# Patient Record
Sex: Male | Born: 1935 | Race: White | Hispanic: No | Marital: Married | Smoking: Former smoker
Health system: Southern US, Community
[De-identification: ages and names within clinical notes are randomized; demographics above are authoritative.]

## PROBLEM LIST (undated history)

## (undated) DIAGNOSIS — E785 Hyperlipidemia, unspecified: Secondary | ICD-10-CM

## (undated) DIAGNOSIS — I10 Essential (primary) hypertension: Secondary | ICD-10-CM

## (undated) DIAGNOSIS — F419 Anxiety disorder, unspecified: Secondary | ICD-10-CM

## (undated) DIAGNOSIS — E079 Disorder of thyroid, unspecified: Secondary | ICD-10-CM

## (undated) DIAGNOSIS — N529 Male erectile dysfunction, unspecified: Secondary | ICD-10-CM

## (undated) DIAGNOSIS — Z940 Kidney transplant status: Secondary | ICD-10-CM

## (undated) DIAGNOSIS — E119 Type 2 diabetes mellitus without complications: Secondary | ICD-10-CM

## (undated) DIAGNOSIS — I770 Arteriovenous fistula, acquired: Secondary | ICD-10-CM

## (undated) HISTORY — DX: Arteriovenous fistula, acquired: I77.0

## (undated) HISTORY — DX: Essential (primary) hypertension: I10

## (undated) HISTORY — DX: Kidney transplant status: Z94.0

## (undated) HISTORY — DX: Type 2 diabetes mellitus without complications: E11.9

## (undated) HISTORY — DX: Male erectile dysfunction, unspecified: N52.9

## (undated) HISTORY — DX: Anxiety disorder, unspecified: F41.9

## (undated) HISTORY — DX: Hyperlipidemia, unspecified: E78.5

## (undated) HISTORY — DX: Disorder of thyroid, unspecified: E07.9

---

## 2009-08-12 ENCOUNTER — Ambulatory Visit: Payer: Self-pay | Admitting: Vascular Surgery

## 2009-09-19 ENCOUNTER — Ambulatory Visit: Payer: Self-pay | Admitting: Vascular Surgery

## 2009-09-19 ENCOUNTER — Ambulatory Visit (HOSPITAL_COMMUNITY): Admission: RE | Admit: 2009-09-19 | Discharge: 2009-09-19 | Payer: Self-pay | Admitting: Vascular Surgery

## 2009-10-15 ENCOUNTER — Encounter: Admission: RE | Admit: 2009-10-15 | Discharge: 2009-10-15 | Payer: Self-pay | Admitting: Orthopaedic Surgery

## 2009-11-11 ENCOUNTER — Ambulatory Visit: Payer: Self-pay | Admitting: Vascular Surgery

## 2010-07-05 ENCOUNTER — Encounter (HOSPITAL_COMMUNITY): Admission: RE | Admit: 2010-07-05 | Discharge: 2010-10-03 | Payer: Self-pay | Admitting: Nephrology

## 2011-01-18 LAB — POCT HEMOGLOBIN-HEMACUE: Hemoglobin: 10.7 g/dL — ABNORMAL LOW (ref 13.0–17.0)

## 2011-01-18 LAB — IRON AND TIBC
Iron: 123 ug/dL (ref 42–135)
Saturation Ratios: 47 % (ref 20–55)
UIBC: 138 ug/dL

## 2011-01-18 LAB — FERRITIN: Ferritin: 627 ng/mL — ABNORMAL HIGH (ref 22–322)

## 2011-01-19 LAB — POCT HEMOGLOBIN-HEMACUE: Hemoglobin: 9.5 g/dL — ABNORMAL LOW (ref 13.0–17.0)

## 2011-02-07 LAB — POCT I-STAT 4, (NA,K, GLUC, HGB,HCT)
Glucose, Bld: 113 mg/dL — ABNORMAL HIGH (ref 70–99)
Potassium: 3.7 mEq/L (ref 3.5–5.1)

## 2011-03-20 NOTE — Assessment & Plan Note (Signed)
OFFICE VISIT   WOOD[place], Cody Bryan  DOB:  Oct 09, 1931                                       11/11/2009  EAVWU#:[id number]   The patient presents today for followup of his right upper arm AV  fistula creation on 09/19/2009.  He reports that he has had stable renal  function.  He is to see Dr. Lowell Guitar in March.  He has an excellent early  maturation now 6 weeks out from his fistula creation.  His cephalic vein  has very good flow all the way up onto the shoulder.  He does have some  moderate tortuosity of the vein but this does appear that it will be  adequate for access if and when needed.  He will see [place] again on an as  needed basis.     Larina Earthly, M.D.  Electronically Signed   TFE/MEDQ  D:  11/11/2009  T:  11/14/2009  Job:  [id number]   cc:   Mindi Slicker. Lowell Guitar, M.D.

## 2011-03-20 NOTE — Procedures (Signed)
CEPHALIC VEIN MAPPING   INDICATION:  Preop cephalic vein map, preop AVF.   HISTORY:  Chronic kidney disease.   EXAM:   The right cephalic vein is compressible.   Diameter measurements range from 0.17 cm to 0.34 cm, however, 0.31 cm to  0.34 cm in the brachium.   The left cephalic vein is compressible.   Diameter measurements range from 0.19 cm to 0.27 cm.   See attached worksheet for all measurements.   IMPRESSION:  1. Patent bilateral cephalic vein which is of acceptable diameter for      use as a dialysis access site in the right brachium.  Right forearm      and left cephalic vein appear of small caliber.  2. Left basilic vein in the brachium measures 0.4 cm to 0.54 cm.   ___________________________________________  Larina Earthly, M.D.   AS/MEDQ  D:  08/12/2009  T:  08/12/2009  Job:  [id number]

## 2011-03-20 NOTE — Consult Note (Signed)
NEW PATIENT CONSULTATION   Cody Bryan, Cody Bryan  DOB:  January 15, 1931                                       08/12/2009  ZOXWR#:[id number]   The patient presents today for evaluation of AV access.  He is a very  pleasant right handed 77 year old gentleman with a long history of  chronic renal insufficiency.  He recently had an episode of worsening  renal function and reports that this is now somewhat improved.  We are  seeing him to discuss access.  he is reportedly on transplant list at  [facility] as of 07/28/2009.   PAST HISTORY:  Significant for elevated cholesterol.  He does not have a  history of premature atherosclerotic disease.  He is married, quit  smoking in 1998, does not drink alcohol.   REVIEW OF SYSTEMS:  Weight is reported 154 pounds.  He is 5 feet 6  inches tall.  He does report shortness of breath with exertion,  constipation, renal insufficiency.   MEDICATION ALLERGIES:  None.   CURRENT MEDICATIONS:  Coreg.  Calcium.  Lovastatin.  Furosemide.  Levothyroxine.   PHYSICAL EXAM:  Well-developed, well-nourished white male appearing  stated age of 88.  Blood pressure is 157/72, pulse 68, respirations 18.  His radial pulses are 2+.  He has moderate diastolic veins bilaterally.  He underwent vein mapping that showed inadequate vein for cephalic vein  fistula of the left arm.  Right arm does have moderate sized cephalic  vein in the upper arms, swelling in the forearm.  I discussed this at  length with the patient, explained the options including AV graft, AV  fistulas, and hemodialysis access catheters.  I have recommended that we  proceed with a right upper arm AV fistula.  He plays a great deal of  golf and has an upcoming event at the end of October.  He will also have  another followup visit with Dr. Lowell Guitar in the first of November.  He  wishes to defer any consideration for surgery until then.  I explained  that we would be available to proceed with  right upper arm AV fistula at  his convenience.   Larina Earthly, M.D.  Electronically Signed   TFE/MEDQ  D:  08/12/2009  T:  08/15/2009  Job:  [id number]   cc:   Mindi Slicker. Lowell Guitar, M.D.

## 2011-09-19 ENCOUNTER — Ambulatory Visit
Admission: RE | Admit: 2011-09-19 | Discharge: 2011-09-19 | Disposition: A | Discharge status: Home / Self Care | Payer: Medicare Other | Source: Ambulatory Visit | Attending: Family Medicine | Admitting: Family Medicine

## 2011-09-19 ENCOUNTER — Other Ambulatory Visit: Payer: Self-pay | Admitting: Family Medicine

## 2011-11-07 DIAGNOSIS — E782 Mixed hyperlipidemia: Secondary | ICD-10-CM | POA: Diagnosis not present

## 2011-11-07 DIAGNOSIS — J40 Bronchitis, not specified as acute or chronic: Secondary | ICD-10-CM | POA: Diagnosis not present

## 2011-11-07 DIAGNOSIS — E039 Hypothyroidism, unspecified: Secondary | ICD-10-CM | POA: Diagnosis not present

## 2011-11-07 DIAGNOSIS — D045 Carcinoma in situ of skin of trunk: Secondary | ICD-10-CM | POA: Diagnosis not present

## 2011-11-07 DIAGNOSIS — J13 Pneumonia due to Streptococcus pneumoniae: Secondary | ICD-10-CM | POA: Diagnosis not present

## 2011-11-08 DIAGNOSIS — J209 Acute bronchitis, unspecified: Secondary | ICD-10-CM | POA: Diagnosis not present

## 2011-11-09 DIAGNOSIS — E785 Hyperlipidemia, unspecified: Secondary | ICD-10-CM | POA: Diagnosis not present

## 2011-11-09 DIAGNOSIS — E119 Type 2 diabetes mellitus without complications: Secondary | ICD-10-CM | POA: Diagnosis not present

## 2011-11-09 DIAGNOSIS — Z79899 Other long term (current) drug therapy: Secondary | ICD-10-CM | POA: Diagnosis not present

## 2011-11-09 DIAGNOSIS — Z940 Kidney transplant status: Secondary | ICD-10-CM | POA: Diagnosis not present

## 2011-11-09 DIAGNOSIS — E039 Hypothyroidism, unspecified: Secondary | ICD-10-CM | POA: Diagnosis not present

## 2011-11-13 DIAGNOSIS — J40 Bronchitis, not specified as acute or chronic: Secondary | ICD-10-CM | POA: Diagnosis not present

## 2011-11-13 DIAGNOSIS — E782 Mixed hyperlipidemia: Secondary | ICD-10-CM | POA: Diagnosis not present

## 2011-11-13 DIAGNOSIS — J218 Acute bronchiolitis due to other specified organisms: Secondary | ICD-10-CM | POA: Diagnosis not present

## 2011-11-13 DIAGNOSIS — E039 Hypothyroidism, unspecified: Secondary | ICD-10-CM | POA: Diagnosis not present

## 2011-12-06 DIAGNOSIS — E782 Mixed hyperlipidemia: Secondary | ICD-10-CM | POA: Diagnosis not present

## 2011-12-06 DIAGNOSIS — E039 Hypothyroidism, unspecified: Secondary | ICD-10-CM | POA: Diagnosis not present

## 2011-12-06 DIAGNOSIS — J40 Bronchitis, not specified as acute or chronic: Secondary | ICD-10-CM | POA: Diagnosis not present

## 2011-12-20 DIAGNOSIS — I120 Hypertensive chronic kidney disease with stage 5 chronic kidney disease or end stage renal disease: Secondary | ICD-10-CM | POA: Diagnosis not present

## 2011-12-20 DIAGNOSIS — E782 Mixed hyperlipidemia: Secondary | ICD-10-CM | POA: Diagnosis not present

## 2011-12-20 DIAGNOSIS — E039 Hypothyroidism, unspecified: Secondary | ICD-10-CM | POA: Diagnosis not present

## 2011-12-31 DIAGNOSIS — Z79899 Other long term (current) drug therapy: Secondary | ICD-10-CM | POA: Diagnosis not present

## 2011-12-31 DIAGNOSIS — Z940 Kidney transplant status: Secondary | ICD-10-CM | POA: Diagnosis not present

## 2012-01-17 DIAGNOSIS — J40 Bronchitis, not specified as acute or chronic: Secondary | ICD-10-CM | POA: Diagnosis not present

## 2012-01-17 DIAGNOSIS — E782 Mixed hyperlipidemia: Secondary | ICD-10-CM | POA: Diagnosis not present

## 2012-01-17 DIAGNOSIS — E039 Hypothyroidism, unspecified: Secondary | ICD-10-CM | POA: Diagnosis not present

## 2012-01-17 DIAGNOSIS — J13 Pneumonia due to Streptococcus pneumoniae: Secondary | ICD-10-CM | POA: Diagnosis not present

## 2012-01-28 DIAGNOSIS — Z48298 Encounter for aftercare following other organ transplant: Secondary | ICD-10-CM | POA: Diagnosis not present

## 2012-01-28 DIAGNOSIS — M1990 Unspecified osteoarthritis, unspecified site: Secondary | ICD-10-CM | POA: Diagnosis not present

## 2012-01-28 DIAGNOSIS — Z940 Kidney transplant status: Secondary | ICD-10-CM | POA: Diagnosis not present

## 2012-01-28 DIAGNOSIS — E139 Other specified diabetes mellitus without complications: Secondary | ICD-10-CM | POA: Diagnosis not present

## 2012-01-28 DIAGNOSIS — E039 Hypothyroidism, unspecified: Secondary | ICD-10-CM | POA: Diagnosis not present

## 2012-01-28 DIAGNOSIS — E891 Postprocedural hypoinsulinemia: Secondary | ICD-10-CM | POA: Diagnosis not present

## 2012-01-28 DIAGNOSIS — Z79899 Other long term (current) drug therapy: Secondary | ICD-10-CM | POA: Diagnosis not present

## 2012-01-28 DIAGNOSIS — E785 Hyperlipidemia, unspecified: Secondary | ICD-10-CM | POA: Diagnosis not present

## 2012-01-28 DIAGNOSIS — Z85828 Personal history of other malignant neoplasm of skin: Secondary | ICD-10-CM | POA: Diagnosis not present

## 2012-01-28 DIAGNOSIS — I10 Essential (primary) hypertension: Secondary | ICD-10-CM | POA: Diagnosis not present

## 2012-01-29 DIAGNOSIS — H43819 Vitreous degeneration, unspecified eye: Secondary | ICD-10-CM | POA: Diagnosis not present

## 2012-01-29 DIAGNOSIS — H25019 Cortical age-related cataract, unspecified eye: Secondary | ICD-10-CM | POA: Diagnosis not present

## 2012-04-01 DIAGNOSIS — Z79899 Other long term (current) drug therapy: Secondary | ICD-10-CM | POA: Diagnosis not present

## 2012-04-01 DIAGNOSIS — Z940 Kidney transplant status: Secondary | ICD-10-CM | POA: Diagnosis not present

## 2012-04-01 DIAGNOSIS — E119 Type 2 diabetes mellitus without complications: Secondary | ICD-10-CM | POA: Diagnosis not present

## 2012-04-09 DIAGNOSIS — Z940 Kidney transplant status: Secondary | ICD-10-CM | POA: Diagnosis not present

## 2012-05-30 DIAGNOSIS — E785 Hyperlipidemia, unspecified: Secondary | ICD-10-CM | POA: Diagnosis not present

## 2012-05-30 DIAGNOSIS — Z79899 Other long term (current) drug therapy: Secondary | ICD-10-CM | POA: Diagnosis not present

## 2012-05-30 DIAGNOSIS — Z940 Kidney transplant status: Secondary | ICD-10-CM | POA: Diagnosis not present

## 2012-07-15 DIAGNOSIS — R300 Dysuria: Secondary | ICD-10-CM | POA: Diagnosis not present

## 2012-07-15 DIAGNOSIS — N183 Chronic kidney disease, stage 3 unspecified: Secondary | ICD-10-CM | POA: Diagnosis not present

## 2012-07-15 DIAGNOSIS — Z940 Kidney transplant status: Secondary | ICD-10-CM | POA: Diagnosis not present

## 2012-10-08 DIAGNOSIS — Z79899 Other long term (current) drug therapy: Secondary | ICD-10-CM | POA: Diagnosis not present

## 2012-10-08 DIAGNOSIS — Z940 Kidney transplant status: Secondary | ICD-10-CM | POA: Diagnosis not present

## 2012-10-08 DIAGNOSIS — E119 Type 2 diabetes mellitus without complications: Secondary | ICD-10-CM | POA: Diagnosis not present

## 2012-10-22 DIAGNOSIS — Z940 Kidney transplant status: Secondary | ICD-10-CM | POA: Diagnosis not present

## 2012-11-26 DIAGNOSIS — L739 Follicular disorder, unspecified: Secondary | ICD-10-CM | POA: Diagnosis not present

## 2012-11-26 DIAGNOSIS — L570 Actinic keratosis: Secondary | ICD-10-CM | POA: Diagnosis not present

## 2012-12-05 DIAGNOSIS — Z940 Kidney transplant status: Secondary | ICD-10-CM | POA: Diagnosis not present

## 2012-12-05 DIAGNOSIS — Z79899 Other long term (current) drug therapy: Secondary | ICD-10-CM | POA: Diagnosis not present

## 2013-02-04 DIAGNOSIS — Z48298 Encounter for aftercare following other organ transplant: Secondary | ICD-10-CM | POA: Diagnosis not present

## 2013-02-04 DIAGNOSIS — Z79899 Other long term (current) drug therapy: Secondary | ICD-10-CM | POA: Diagnosis not present

## 2013-02-04 DIAGNOSIS — I10 Essential (primary) hypertension: Secondary | ICD-10-CM | POA: Diagnosis not present

## 2013-02-04 DIAGNOSIS — Z940 Kidney transplant status: Secondary | ICD-10-CM | POA: Diagnosis not present

## 2013-02-04 DIAGNOSIS — E119 Type 2 diabetes mellitus without complications: Secondary | ICD-10-CM | POA: Diagnosis not present

## 2013-04-15 DIAGNOSIS — Z940 Kidney transplant status: Secondary | ICD-10-CM | POA: Diagnosis not present

## 2013-04-15 DIAGNOSIS — Z79899 Other long term (current) drug therapy: Secondary | ICD-10-CM | POA: Diagnosis not present

## 2013-04-15 DIAGNOSIS — E119 Type 2 diabetes mellitus without complications: Secondary | ICD-10-CM | POA: Diagnosis not present

## 2013-05-27 DIAGNOSIS — Z940 Kidney transplant status: Secondary | ICD-10-CM | POA: Diagnosis not present

## 2013-06-08 DIAGNOSIS — C770 Secondary and unspecified malignant neoplasm of lymph nodes of head, face and neck: Secondary | ICD-10-CM | POA: Diagnosis not present

## 2013-06-08 DIAGNOSIS — I120 Hypertensive chronic kidney disease with stage 5 chronic kidney disease or end stage renal disease: Secondary | ICD-10-CM | POA: Diagnosis not present

## 2013-07-08 DIAGNOSIS — Z79899 Other long term (current) drug therapy: Secondary | ICD-10-CM | POA: Diagnosis not present

## 2013-07-08 DIAGNOSIS — Z940 Kidney transplant status: Secondary | ICD-10-CM | POA: Diagnosis not present

## 2013-07-08 DIAGNOSIS — E039 Hypothyroidism, unspecified: Secondary | ICD-10-CM | POA: Diagnosis not present

## 2013-07-08 DIAGNOSIS — E119 Type 2 diabetes mellitus without complications: Secondary | ICD-10-CM | POA: Diagnosis not present

## 2013-08-13 DIAGNOSIS — H5310 Unspecified subjective visual disturbances: Secondary | ICD-10-CM | POA: Diagnosis not present

## 2013-08-13 DIAGNOSIS — H53129 Transient visual loss, unspecified eye: Secondary | ICD-10-CM | POA: Diagnosis not present

## 2013-08-13 DIAGNOSIS — H01029 Squamous blepharitis unspecified eye, unspecified eyelid: Secondary | ICD-10-CM | POA: Diagnosis not present

## 2013-08-13 DIAGNOSIS — H1045 Other chronic allergic conjunctivitis: Secondary | ICD-10-CM | POA: Diagnosis not present

## 2013-09-14 DIAGNOSIS — Z79899 Other long term (current) drug therapy: Secondary | ICD-10-CM | POA: Diagnosis not present

## 2013-09-14 DIAGNOSIS — Z940 Kidney transplant status: Secondary | ICD-10-CM | POA: Diagnosis not present

## 2013-09-16 DIAGNOSIS — F411 Generalized anxiety disorder: Secondary | ICD-10-CM | POA: Diagnosis not present

## 2013-09-16 DIAGNOSIS — I129 Hypertensive chronic kidney disease with stage 1 through stage 4 chronic kidney disease, or unspecified chronic kidney disease: Secondary | ICD-10-CM | POA: Diagnosis not present

## 2013-09-16 DIAGNOSIS — Z940 Kidney transplant status: Secondary | ICD-10-CM | POA: Diagnosis not present

## 2013-10-27 DIAGNOSIS — E782 Mixed hyperlipidemia: Secondary | ICD-10-CM | POA: Diagnosis not present

## 2013-10-27 DIAGNOSIS — E119 Type 2 diabetes mellitus without complications: Secondary | ICD-10-CM | POA: Diagnosis not present

## 2013-10-27 DIAGNOSIS — E039 Hypothyroidism, unspecified: Secondary | ICD-10-CM | POA: Diagnosis not present

## 2013-11-25 DIAGNOSIS — E039 Hypothyroidism, unspecified: Secondary | ICD-10-CM | POA: Diagnosis not present

## 2013-11-25 DIAGNOSIS — J00 Acute nasopharyngitis [common cold]: Secondary | ICD-10-CM | POA: Diagnosis not present

## 2013-12-02 DIAGNOSIS — Z940 Kidney transplant status: Secondary | ICD-10-CM | POA: Diagnosis not present

## 2013-12-02 DIAGNOSIS — E119 Type 2 diabetes mellitus without complications: Secondary | ICD-10-CM | POA: Diagnosis not present

## 2013-12-02 DIAGNOSIS — Z79899 Other long term (current) drug therapy: Secondary | ICD-10-CM | POA: Diagnosis not present

## 2014-01-02 DIAGNOSIS — J209 Acute bronchitis, unspecified: Secondary | ICD-10-CM | POA: Diagnosis not present

## 2014-01-02 DIAGNOSIS — J218 Acute bronchiolitis due to other specified organisms: Secondary | ICD-10-CM | POA: Diagnosis not present

## 2014-02-15 DIAGNOSIS — E1129 Type 2 diabetes mellitus with other diabetic kidney complication: Secondary | ICD-10-CM | POA: Diagnosis not present

## 2014-02-15 DIAGNOSIS — E119 Type 2 diabetes mellitus without complications: Secondary | ICD-10-CM | POA: Diagnosis not present

## 2014-02-15 DIAGNOSIS — Z79899 Other long term (current) drug therapy: Secondary | ICD-10-CM | POA: Diagnosis not present

## 2014-02-15 DIAGNOSIS — Z48298 Encounter for aftercare following other organ transplant: Secondary | ICD-10-CM | POA: Diagnosis not present

## 2014-02-15 DIAGNOSIS — E785 Hyperlipidemia, unspecified: Secondary | ICD-10-CM | POA: Diagnosis not present

## 2014-02-15 DIAGNOSIS — I10 Essential (primary) hypertension: Secondary | ICD-10-CM | POA: Diagnosis not present

## 2014-02-15 DIAGNOSIS — N058 Unspecified nephritic syndrome with other morphologic changes: Secondary | ICD-10-CM | POA: Diagnosis not present

## 2014-02-15 DIAGNOSIS — Z940 Kidney transplant status: Secondary | ICD-10-CM | POA: Diagnosis not present

## 2014-03-24 DIAGNOSIS — I10 Essential (primary) hypertension: Secondary | ICD-10-CM | POA: Diagnosis not present

## 2014-03-24 DIAGNOSIS — E119 Type 2 diabetes mellitus without complications: Secondary | ICD-10-CM | POA: Diagnosis not present

## 2014-03-24 DIAGNOSIS — L259 Unspecified contact dermatitis, unspecified cause: Secondary | ICD-10-CM | POA: Diagnosis not present

## 2014-03-24 DIAGNOSIS — Z940 Kidney transplant status: Secondary | ICD-10-CM | POA: Diagnosis not present

## 2014-04-14 DIAGNOSIS — L821 Other seborrheic keratosis: Secondary | ICD-10-CM | POA: Diagnosis not present

## 2014-04-14 DIAGNOSIS — L819 Disorder of pigmentation, unspecified: Secondary | ICD-10-CM | POA: Diagnosis not present

## 2014-04-14 DIAGNOSIS — L570 Actinic keratosis: Secondary | ICD-10-CM | POA: Diagnosis not present

## 2014-04-14 DIAGNOSIS — D235 Other benign neoplasm of skin of trunk: Secondary | ICD-10-CM | POA: Diagnosis not present

## 2014-04-22 DIAGNOSIS — Z940 Kidney transplant status: Secondary | ICD-10-CM | POA: Diagnosis not present

## 2014-05-12 DIAGNOSIS — C44711 Basal cell carcinoma of skin of unspecified lower limb, including hip: Secondary | ICD-10-CM | POA: Diagnosis not present

## 2014-05-12 DIAGNOSIS — L570 Actinic keratosis: Secondary | ICD-10-CM | POA: Diagnosis not present

## 2014-05-27 DIAGNOSIS — C44711 Basal cell carcinoma of skin of unspecified lower limb, including hip: Secondary | ICD-10-CM | POA: Diagnosis not present

## 2014-07-01 DIAGNOSIS — Z940 Kidney transplant status: Secondary | ICD-10-CM | POA: Diagnosis not present

## 2014-07-01 DIAGNOSIS — E039 Hypothyroidism, unspecified: Secondary | ICD-10-CM | POA: Diagnosis not present

## 2014-07-01 DIAGNOSIS — E119 Type 2 diabetes mellitus without complications: Secondary | ICD-10-CM | POA: Diagnosis not present

## 2014-07-07 DIAGNOSIS — L570 Actinic keratosis: Secondary | ICD-10-CM | POA: Diagnosis not present

## 2014-07-07 DIAGNOSIS — Z85828 Personal history of other malignant neoplasm of skin: Secondary | ICD-10-CM | POA: Diagnosis not present

## 2014-07-14 DIAGNOSIS — L259 Unspecified contact dermatitis, unspecified cause: Secondary | ICD-10-CM | POA: Diagnosis not present

## 2014-07-14 DIAGNOSIS — I10 Essential (primary) hypertension: Secondary | ICD-10-CM | POA: Diagnosis not present

## 2014-07-14 DIAGNOSIS — Z940 Kidney transplant status: Secondary | ICD-10-CM | POA: Diagnosis not present

## 2014-07-14 DIAGNOSIS — E119 Type 2 diabetes mellitus without complications: Secondary | ICD-10-CM | POA: Diagnosis not present

## 2014-09-01 DIAGNOSIS — E039 Hypothyroidism, unspecified: Secondary | ICD-10-CM | POA: Diagnosis not present

## 2014-09-01 DIAGNOSIS — Z940 Kidney transplant status: Secondary | ICD-10-CM | POA: Diagnosis not present

## 2014-10-06 DIAGNOSIS — L820 Inflamed seborrheic keratosis: Secondary | ICD-10-CM | POA: Diagnosis not present

## 2014-10-06 DIAGNOSIS — L570 Actinic keratosis: Secondary | ICD-10-CM | POA: Diagnosis not present

## 2014-10-06 DIAGNOSIS — Z08 Encounter for follow-up examination after completed treatment for malignant neoplasm: Secondary | ICD-10-CM | POA: Diagnosis not present

## 2014-10-06 DIAGNOSIS — Z85828 Personal history of other malignant neoplasm of skin: Secondary | ICD-10-CM | POA: Diagnosis not present

## 2014-11-08 DIAGNOSIS — Z940 Kidney transplant status: Secondary | ICD-10-CM | POA: Diagnosis not present

## 2014-11-08 DIAGNOSIS — E039 Hypothyroidism, unspecified: Secondary | ICD-10-CM | POA: Diagnosis not present

## 2014-11-17 DIAGNOSIS — E119 Type 2 diabetes mellitus without complications: Secondary | ICD-10-CM | POA: Diagnosis not present

## 2014-11-17 DIAGNOSIS — R0600 Dyspnea, unspecified: Secondary | ICD-10-CM | POA: Diagnosis not present

## 2014-11-17 DIAGNOSIS — R600 Localized edema: Secondary | ICD-10-CM | POA: Diagnosis not present

## 2014-11-17 DIAGNOSIS — Z940 Kidney transplant status: Secondary | ICD-10-CM | POA: Diagnosis not present

## 2014-12-02 ENCOUNTER — Ambulatory Visit (INDEPENDENT_AMBULATORY_CARE_PROVIDER_SITE_OTHER): Payer: Medicare Other | Admitting: Cardiology

## 2014-12-02 ENCOUNTER — Encounter: Payer: Self-pay | Admitting: Cardiology

## 2014-12-02 VITALS — BP 160/80 | HR 61 | Ht 66.0 in | Wt 158.0 lb

## 2014-12-02 DIAGNOSIS — Z940 Kidney transplant status: Secondary | ICD-10-CM | POA: Diagnosis not present

## 2014-12-02 DIAGNOSIS — I10 Essential (primary) hypertension: Secondary | ICD-10-CM

## 2014-12-02 DIAGNOSIS — E0821 Diabetes mellitus due to underlying condition with diabetic nephropathy: Secondary | ICD-10-CM | POA: Diagnosis not present

## 2014-12-02 DIAGNOSIS — R0600 Dyspnea, unspecified: Secondary | ICD-10-CM | POA: Diagnosis not present

## 2014-12-02 NOTE — Patient Instructions (Signed)
The current medical regimen is effective;  continue present plan and medications.  Your physician has requested that you have a lexiscan myoview. For further information please visit [email or web address]. Please follow instruction sheet, as given.  Your physician has requested that you have an echocardiogram. Echocardiography is a painless test that uses sound waves to create images of your heart. It provides your doctor with information about the size and shape of your heart and how well your heart's chambers and valves are working. This procedure takes approximately one hour. There are no restrictions for this procedure.  Follow up as needed.  Thank you for choosing [facility]!!

## 2014-12-02 NOTE — Progress Notes (Signed)
[facility]. [address], [address], [place]  [address] Phone: [phone] Fax:  [phone]  Date:  12/02/2014   ID:  Cody Bryan, DOB 14-Sep-1931, MRN [id number]  PCP:  No primary care provider on file.   History of Present Illness: Cody Bryan is a 81 y.o. male here for evaluation of dyspnea. Status post renal transplant. Has a fistula in place upper right extremity. Has not needed to utilize. Has never had a diagnosis of high output heart failure as a result of fistula.  In Summertime not much of a problem. In the Winter is worse. Refuse to exercise. Just started about 3 months ago. Driveway 086 ft. Garbage cans, felt some tightness in chest upper, worried him. Had a scare 6 weeks ago, cramp in back, but 15 min later, felt in chest could not stand it. Eased off.   [place], asbestos - Got out [employer] Readings from Last 3 Encounters:  12/02/14 158 lb (71.668 kg)     Past Medical History  Diagnosis Date  . Diabetes mellitus without complication   . Erectile dysfunction   . Deceased-donor kidney transplant   . Thyroid disease     HYPOTHYROIDISM   . Anxiety   . Dyslipidemia   . Hypertension   . AV fistula     UPPER RIGHT EXTREMITY    No past surgical history on file.  Current Outpatient Prescriptions  Medication Sig Dispense Refill  . aspirin 81 MG tablet Take 81 mg by mouth daily.    . carvedilol (COREG) 25 MG tablet Take 25 mg by mouth 2 (two) times daily with a meal.    . cloNIDine (CATAPRES) 0.3 MG tablet Take 0.3 mg by mouth 2 (two) times daily.    . dapsone 25 MG tablet Take 25 mg by mouth daily.    Marland Kitchen glipiZIDE (GLUCOTROL XL) 2.5 MG 24 hr tablet Take 2.5 mg by mouth daily with breakfast.    . levothyroxine (SYNTHROID, LEVOTHROID) 88 MCG tablet Take 88 mcg by mouth daily before breakfast.    . lovastatin (MEVACOR) 40 MG tablet Take 40 mg by mouth at bedtime.    . tacrolimus (PROGRAF) 0.5 MG capsule Take 0.5 mg by mouth 2 (two)  times daily.     No current facility-administered medications for this visit.    Allergies:   No Known Allergies  Social History:  The patient  reports that he has quit smoking. He does not have any smokeless tobacco history on file. Quit smoking after 50 years. Quit because of tax raise.   No early CAD. Father lived to 8.   ROS:  Please see the history of present illness.   No bleeding, no syncope, no orthopnea, no PND. Positive for occasional atypical chest pain, dyspnea. Mild edema.  All other systems reviewed and negative.   PHYSICAL EXAM: VS:  BP 160/80 mmHg  Pulse 61  Ht 5\' 6"  (1.676 m)  Wt 158 lb (71.668 kg)  BMI 25.51 kg/m2 Well nourished, well developed, in no acute distress HEENT: normal, [place]/AT, EOMI Neck: no JVD, normal carotid upstroke, no bruit Cardiac:  normal S1, S2; RRR; 1/6 holosystolic murmur heard left upper sternal border Lungs:  clear to auscultation bilaterally, no wheezing, rhonchi or rales Abd: soft, nontender, no hepatomegaly, no bruits Ext: Trace edema, 2+ distal pulses, right arm fistula noted bruit. Skin: warm and dry GU: deferred Neuro: no focal abnormalities noted, AAO x 3  EKG:  12/02/14-sinus rhythm, 64, no other abnormalities   ASSESSMENT AND PLAN:  1. Atypical chest pain/dyspnea-given his diabetes which is a coronary artery disease equivalent we will go ahead and perform a nuclear stress test, pharmacologic to exclude ischemia. I will also check an echocardiogram given his dyspnea to ensure that he does not have a cardiomyopathy. Another thought given his fistula could be high output heart failure although this is fairly rare. Deconditioning may be playing a role as well. He is more active in the summer time it does not seem to have as much of a dyspnea issue. Creatinine has been stable at 1.5, hemoglobin 12.0. We will follow-up results of studies. If cardiac evaluation is unremarkable, I would suggest further pulmonary evaluation given his  extensive smoking history as well as prior asbestos exposure.  Signed, Candee Furbish, MD [facility]  12/02/2014 2:47 PM

## 2014-12-08 ENCOUNTER — Ambulatory Visit (HOSPITAL_COMMUNITY): Payer: Medicare Other | Attending: Internal Medicine | Admitting: Cardiology

## 2014-12-08 ENCOUNTER — Ambulatory Visit (HOSPITAL_BASED_OUTPATIENT_CLINIC_OR_DEPARTMENT_OTHER): Payer: Medicare Other | Admitting: Radiology

## 2014-12-08 DIAGNOSIS — R079 Chest pain, unspecified: Secondary | ICD-10-CM

## 2014-12-08 DIAGNOSIS — R0600 Dyspnea, unspecified: Secondary | ICD-10-CM | POA: Diagnosis not present

## 2014-12-08 MED ORDER — REGADENOSON 0.4 MG/5ML IV SOLN
0.4000 mg | Freq: Once | INTRAVENOUS | Status: AC
  Administered 2014-12-08: 0.4 mg via INTRAVENOUS

## 2014-12-08 MED ORDER — TECHNETIUM TC 99M SESTAMIBI GENERIC - CARDIOLITE
30.0000 | Freq: Once | INTRAVENOUS | Status: AC | PRN
  Administered 2014-12-08: 30 via INTRAVENOUS

## 2014-12-08 MED ORDER — TECHNETIUM TC 99M SESTAMIBI GENERIC - CARDIOLITE
10.0000 | Freq: Once | INTRAVENOUS | Status: AC | PRN
  Administered 2014-12-08: 10 via INTRAVENOUS

## 2014-12-08 NOTE — Progress Notes (Signed)
Echo performed. 

## 2014-12-08 NOTE — Progress Notes (Signed)
[facility] [place], [place] [address] [phone]    Cardiology Nuclear Med Study  Cody Bryan is a 81 y.o. male     MRN : [id number]     DOB: 1931/02/17  Procedure Date: 12/08/2014  Nuclear Med Background Indication for Stress Test:  Evaluation for Ischemia History: No prior known history of CAD, and 2 or 3 years ago Myocardial Perfusion Imaging-Normal at [facility] per patient Cardiac Risk Factors: Hypertension and NIDDM  Symptoms: Chest Tightness with/without exertion (last occurrence yesterday), DOE and SOB   Nuclear Pre-Procedure Caffeine/Decaff Intake:  None NPO After: 9:00pm   Lungs:  clear O2 Sat: 93% on room air. IV 0.9% NS with Angio Cath:  22g  IV Site: L Antecubital  IV Started by:  Crissie Figures, RN  Chest Size (in):  44 Cup Size: n/a  Height: 5\' 6"  (1.676 m)  Weight:  158 lb (71.668 kg)  BMI:  Body mass index is 25.51 kg/(m^2). Tech Comments:  N/A    Nuclear Med Study 1 or 2 day study: 1 day  Stress Test Type:  Lexiscan  Reading MD: N/A  Order Authorizing Provider:  Candee Furbish, MD  Resting Radionuclide: Technetium 38m Sestamibi  Resting Radionuclide Dose: 11.0 mCi   Stress Radionuclide:  Technetium 3m Sestamibi  Stress Radionuclide Dose: 33.0 mCi           Stress Protocol Rest HR: 53 Stress HR: 77  Rest BP: 132/65 Stress BP: 158/66  Exercise Time (min): n/a METS: n/a   Predicted Max HR: 139 bpm % Max HR: 55.4 bpm Rate Pressure Product: 12166   Dose of Adenosine (mg):  n/a Dose of Lexiscan: 0.4 mg  Dose of Atropine (mg): n/a Dose of Dobutamine: n/a mcg/kg/min (at max HR)  Stress Test Technologist: Irven Baltimore, RN  Nuclear Technologist:  Earl Many, CNMT     Rest Procedure:  Myocardial perfusion imaging was performed at rest 45 minutes following the intravenous administration of Technetium 26m Sestamibi. Rest ECG: NSR - Normal EKG  Stress Procedure:  The patient received IV Lexiscan 0.4 mg over  15-seconds.  Technetium 39m Sestamibi injected at 30-seconds.  The patient complained of SOB, chest tightness, and weakness with Lexiscan. Quantitative spect images were obtained after a 45 minute delay. Stress ECG: No significant change from baseline ECG  QPS Raw Data Images:  Normal; no motion artifact; normal heart/lung ratio. Stress Images:  Medium sized, mild basal to mid inferior perfusion defect. Rest Images:  Medium-sized, mild basal to mid inferior perfusion defect.  Subtraction (SDS):  Fixed medium-sized, mild basal to mid inferior perfusion defect.  Transient Ischemic Dilatation (Normal <1.22):  1.00 Lung/Heart Ratio (Normal <0.45):  0.27  Quantitative Gated Spect Images QGS EDV:  122 ml QGS ESV:  47 ml  Impression Exercise Capacity:  Lexiscan with no exercise. BP Response:  Normal blood pressure response. Clinical Symptoms:  Weakness, chest tightness, dyspnea.  ECG Impression:  No ischemic changes.  Comparison with Prior Nuclear Study: No images to compare  Overall Impression:  Low risk stress nuclear study with a fixed medium-sized, mild basal to mid inferior perfusion defect.  Defect actually looks worse at rest than with stress.  Given normal inferior wall motion, this may be diaphragmatic attenuation.  No ischemia. .  LV Ejection Fraction: 61%.  LV Wall Motion:  NL LV Function; NL Wall Motion   [name] 12/08/2014

## 2014-12-24 DIAGNOSIS — Z940 Kidney transplant status: Secondary | ICD-10-CM | POA: Diagnosis not present

## 2014-12-27 DIAGNOSIS — Z940 Kidney transplant status: Secondary | ICD-10-CM | POA: Diagnosis not present

## 2015-01-03 DIAGNOSIS — Z940 Kidney transplant status: Secondary | ICD-10-CM | POA: Diagnosis not present

## 2015-01-18 DIAGNOSIS — Z940 Kidney transplant status: Secondary | ICD-10-CM | POA: Diagnosis not present

## 2015-01-18 DIAGNOSIS — E039 Hypothyroidism, unspecified: Secondary | ICD-10-CM | POA: Diagnosis not present

## 2015-01-26 DIAGNOSIS — L570 Actinic keratosis: Secondary | ICD-10-CM | POA: Diagnosis not present

## 2015-01-26 DIAGNOSIS — L720 Epidermal cyst: Secondary | ICD-10-CM | POA: Diagnosis not present

## 2015-01-26 DIAGNOSIS — D485 Neoplasm of uncertain behavior of skin: Secondary | ICD-10-CM | POA: Diagnosis not present

## 2015-02-16 DIAGNOSIS — Z23 Encounter for immunization: Secondary | ICD-10-CM | POA: Diagnosis not present

## 2015-02-16 DIAGNOSIS — E785 Hyperlipidemia, unspecified: Secondary | ICD-10-CM | POA: Diagnosis not present

## 2015-02-16 DIAGNOSIS — E119 Type 2 diabetes mellitus without complications: Secondary | ICD-10-CM | POA: Diagnosis not present

## 2015-02-16 DIAGNOSIS — I120 Hypertensive chronic kidney disease with stage 5 chronic kidney disease or end stage renal disease: Secondary | ICD-10-CM | POA: Diagnosis not present

## 2015-02-16 DIAGNOSIS — Z940 Kidney transplant status: Secondary | ICD-10-CM | POA: Diagnosis not present

## 2015-02-16 DIAGNOSIS — Z792 Long term (current) use of antibiotics: Secondary | ICD-10-CM | POA: Diagnosis not present

## 2015-02-16 DIAGNOSIS — D8989 Other specified disorders involving the immune mechanism, not elsewhere classified: Secondary | ICD-10-CM | POA: Diagnosis not present

## 2015-02-16 DIAGNOSIS — D899 Disorder involving the immune mechanism, unspecified: Secondary | ICD-10-CM | POA: Diagnosis not present

## 2015-02-16 DIAGNOSIS — N186 End stage renal disease: Secondary | ICD-10-CM | POA: Diagnosis not present

## 2015-02-16 DIAGNOSIS — L989 Disorder of the skin and subcutaneous tissue, unspecified: Secondary | ICD-10-CM | POA: Diagnosis not present

## 2015-02-16 DIAGNOSIS — Z7982 Long term (current) use of aspirin: Secondary | ICD-10-CM | POA: Diagnosis not present

## 2015-02-23 DIAGNOSIS — Z940 Kidney transplant status: Secondary | ICD-10-CM | POA: Diagnosis not present

## 2015-03-09 DIAGNOSIS — Z940 Kidney transplant status: Secondary | ICD-10-CM | POA: Diagnosis not present

## 2015-05-11 DIAGNOSIS — J209 Acute bronchitis, unspecified: Secondary | ICD-10-CM | POA: Diagnosis not present

## 2015-05-11 DIAGNOSIS — J0100 Acute maxillary sinusitis, unspecified: Secondary | ICD-10-CM | POA: Diagnosis not present

## 2015-05-18 DIAGNOSIS — E119 Type 2 diabetes mellitus without complications: Secondary | ICD-10-CM | POA: Diagnosis not present

## 2015-05-18 DIAGNOSIS — R0600 Dyspnea, unspecified: Secondary | ICD-10-CM | POA: Diagnosis not present

## 2015-05-18 DIAGNOSIS — Z940 Kidney transplant status: Secondary | ICD-10-CM | POA: Diagnosis not present

## 2015-05-23 ENCOUNTER — Encounter: Payer: Self-pay | Admitting: Cardiology

## 2015-05-26 DIAGNOSIS — Z940 Kidney transplant status: Secondary | ICD-10-CM | POA: Diagnosis not present

## 2015-08-17 DIAGNOSIS — Z940 Kidney transplant status: Secondary | ICD-10-CM | POA: Diagnosis not present

## 2015-08-17 DIAGNOSIS — E119 Type 2 diabetes mellitus without complications: Secondary | ICD-10-CM | POA: Diagnosis not present

## 2015-08-17 DIAGNOSIS — E039 Hypothyroidism, unspecified: Secondary | ICD-10-CM | POA: Diagnosis not present

## 2015-08-17 DIAGNOSIS — E785 Hyperlipidemia, unspecified: Secondary | ICD-10-CM | POA: Diagnosis not present

## 2015-09-12 DIAGNOSIS — H353131 Nonexudative age-related macular degeneration, bilateral, early dry stage: Secondary | ICD-10-CM | POA: Diagnosis not present

## 2015-09-12 DIAGNOSIS — H2513 Age-related nuclear cataract, bilateral: Secondary | ICD-10-CM | POA: Diagnosis not present

## 2015-09-20 DIAGNOSIS — J0100 Acute maxillary sinusitis, unspecified: Secondary | ICD-10-CM | POA: Diagnosis not present

## 2015-11-01 ENCOUNTER — Encounter: Payer: Self-pay | Admitting: *Deleted

## 2015-11-02 ENCOUNTER — Encounter: Payer: Self-pay | Admitting: *Deleted

## 2015-11-30 DIAGNOSIS — L570 Actinic keratosis: Secondary | ICD-10-CM | POA: Diagnosis not present

## 2015-11-30 DIAGNOSIS — Z85828 Personal history of other malignant neoplasm of skin: Secondary | ICD-10-CM | POA: Diagnosis not present

## 2015-12-28 DIAGNOSIS — E119 Type 2 diabetes mellitus without complications: Secondary | ICD-10-CM | POA: Diagnosis not present

## 2015-12-28 DIAGNOSIS — E785 Hyperlipidemia, unspecified: Secondary | ICD-10-CM | POA: Diagnosis not present

## 2015-12-28 DIAGNOSIS — Z940 Kidney transplant status: Secondary | ICD-10-CM | POA: Diagnosis not present

## 2015-12-28 DIAGNOSIS — R0600 Dyspnea, unspecified: Secondary | ICD-10-CM | POA: Diagnosis not present

## 2016-01-09 DIAGNOSIS — Z940 Kidney transplant status: Secondary | ICD-10-CM | POA: Diagnosis not present

## 2016-02-20 DIAGNOSIS — E119 Type 2 diabetes mellitus without complications: Secondary | ICD-10-CM | POA: Diagnosis not present

## 2016-02-20 DIAGNOSIS — E785 Hyperlipidemia, unspecified: Secondary | ICD-10-CM | POA: Diagnosis not present

## 2016-02-20 DIAGNOSIS — Z7982 Long term (current) use of aspirin: Secondary | ICD-10-CM | POA: Diagnosis not present

## 2016-02-20 DIAGNOSIS — C4490 Unspecified malignant neoplasm of skin, unspecified: Secondary | ICD-10-CM | POA: Diagnosis not present

## 2016-02-20 DIAGNOSIS — I120 Hypertensive chronic kidney disease with stage 5 chronic kidney disease or end stage renal disease: Secondary | ICD-10-CM | POA: Diagnosis not present

## 2016-02-20 DIAGNOSIS — I10 Essential (primary) hypertension: Secondary | ICD-10-CM | POA: Diagnosis not present

## 2016-02-20 DIAGNOSIS — E039 Hypothyroidism, unspecified: Secondary | ICD-10-CM | POA: Diagnosis not present

## 2016-02-20 DIAGNOSIS — Z940 Kidney transplant status: Secondary | ICD-10-CM | POA: Diagnosis not present

## 2016-02-20 DIAGNOSIS — M1990 Unspecified osteoarthritis, unspecified site: Secondary | ICD-10-CM | POA: Diagnosis not present

## 2016-02-20 DIAGNOSIS — Z85828 Personal history of other malignant neoplasm of skin: Secondary | ICD-10-CM | POA: Diagnosis not present

## 2016-02-20 DIAGNOSIS — Z8719 Personal history of other diseases of the digestive system: Secondary | ICD-10-CM | POA: Diagnosis not present

## 2016-02-20 DIAGNOSIS — Z79899 Other long term (current) drug therapy: Secondary | ICD-10-CM | POA: Diagnosis not present

## 2016-03-02 DIAGNOSIS — Z940 Kidney transplant status: Secondary | ICD-10-CM | POA: Diagnosis not present

## 2016-04-06 DIAGNOSIS — M79602 Pain in left arm: Secondary | ICD-10-CM | POA: Diagnosis not present

## 2016-04-06 DIAGNOSIS — M79601 Pain in right arm: Secondary | ICD-10-CM | POA: Diagnosis not present

## 2016-05-30 DIAGNOSIS — L570 Actinic keratosis: Secondary | ICD-10-CM | POA: Diagnosis not present

## 2016-05-30 DIAGNOSIS — L853 Xerosis cutis: Secondary | ICD-10-CM | POA: Diagnosis not present

## 2016-05-30 DIAGNOSIS — L821 Other seborrheic keratosis: Secondary | ICD-10-CM | POA: Diagnosis not present

## 2016-07-25 DIAGNOSIS — E785 Hyperlipidemia, unspecified: Secondary | ICD-10-CM | POA: Diagnosis not present

## 2016-07-25 DIAGNOSIS — Z6827 Body mass index (BMI) 27.0-27.9, adult: Secondary | ICD-10-CM | POA: Diagnosis not present

## 2016-07-25 DIAGNOSIS — R300 Dysuria: Secondary | ICD-10-CM | POA: Diagnosis not present

## 2016-07-25 DIAGNOSIS — E119 Type 2 diabetes mellitus without complications: Secondary | ICD-10-CM | POA: Diagnosis not present

## 2016-07-25 DIAGNOSIS — Z940 Kidney transplant status: Secondary | ICD-10-CM | POA: Diagnosis not present

## 2016-07-30 DIAGNOSIS — R319 Hematuria, unspecified: Secondary | ICD-10-CM | POA: Diagnosis not present

## 2016-09-12 DIAGNOSIS — H353132 Nonexudative age-related macular degeneration, bilateral, intermediate dry stage: Secondary | ICD-10-CM | POA: Diagnosis not present

## 2016-09-12 DIAGNOSIS — H2513 Age-related nuclear cataract, bilateral: Secondary | ICD-10-CM | POA: Diagnosis not present

## 2016-10-05 DIAGNOSIS — R350 Frequency of micturition: Secondary | ICD-10-CM | POA: Diagnosis not present

## 2016-10-05 DIAGNOSIS — R351 Nocturia: Secondary | ICD-10-CM | POA: Diagnosis not present

## 2016-10-05 DIAGNOSIS — R3121 Asymptomatic microscopic hematuria: Secondary | ICD-10-CM | POA: Diagnosis not present

## 2016-10-17 DIAGNOSIS — R3129 Other microscopic hematuria: Secondary | ICD-10-CM | POA: Diagnosis not present

## 2016-10-17 DIAGNOSIS — R3121 Asymptomatic microscopic hematuria: Secondary | ICD-10-CM | POA: Diagnosis not present

## 2016-10-24 DIAGNOSIS — N2889 Other specified disorders of kidney and ureter: Secondary | ICD-10-CM | POA: Diagnosis not present

## 2016-10-24 DIAGNOSIS — Z940 Kidney transplant status: Secondary | ICD-10-CM | POA: Diagnosis not present

## 2016-10-24 DIAGNOSIS — R8290 Unspecified abnormal findings in urine: Secondary | ICD-10-CM | POA: Diagnosis not present

## 2016-11-07 DIAGNOSIS — E119 Type 2 diabetes mellitus without complications: Secondary | ICD-10-CM | POA: Diagnosis not present

## 2016-11-07 DIAGNOSIS — R8290 Unspecified abnormal findings in urine: Secondary | ICD-10-CM | POA: Diagnosis not present

## 2016-11-07 DIAGNOSIS — R319 Hematuria, unspecified: Secondary | ICD-10-CM | POA: Diagnosis not present

## 2016-11-07 DIAGNOSIS — N329 Bladder disorder, unspecified: Secondary | ICD-10-CM | POA: Diagnosis not present

## 2016-11-07 DIAGNOSIS — Z7982 Long term (current) use of aspirin: Secondary | ICD-10-CM | POA: Diagnosis not present

## 2016-11-07 DIAGNOSIS — E1122 Type 2 diabetes mellitus with diabetic chronic kidney disease: Secondary | ICD-10-CM | POA: Diagnosis not present

## 2016-11-07 DIAGNOSIS — I129 Hypertensive chronic kidney disease with stage 1 through stage 4 chronic kidney disease, or unspecified chronic kidney disease: Secondary | ICD-10-CM | POA: Diagnosis not present

## 2016-11-07 DIAGNOSIS — Z7984 Long term (current) use of oral hypoglycemic drugs: Secondary | ICD-10-CM | POA: Diagnosis not present

## 2016-11-07 DIAGNOSIS — I10 Essential (primary) hypertension: Secondary | ICD-10-CM | POA: Diagnosis not present

## 2016-11-07 DIAGNOSIS — Z940 Kidney transplant status: Secondary | ICD-10-CM | POA: Diagnosis not present

## 2016-11-07 DIAGNOSIS — R931 Abnormal findings on diagnostic imaging of heart and coronary circulation: Secondary | ICD-10-CM | POA: Diagnosis not present

## 2016-11-07 DIAGNOSIS — Z86018 Personal history of other benign neoplasm: Secondary | ICD-10-CM | POA: Diagnosis not present

## 2016-11-07 DIAGNOSIS — J984 Other disorders of lung: Secondary | ICD-10-CM | POA: Diagnosis not present

## 2016-11-07 DIAGNOSIS — R791 Abnormal coagulation profile: Secondary | ICD-10-CM | POA: Diagnosis not present

## 2016-11-07 DIAGNOSIS — N3289 Other specified disorders of bladder: Secondary | ICD-10-CM | POA: Diagnosis not present

## 2016-11-07 DIAGNOSIS — N189 Chronic kidney disease, unspecified: Secondary | ICD-10-CM | POA: Diagnosis not present

## 2016-11-07 DIAGNOSIS — Z79899 Other long term (current) drug therapy: Secondary | ICD-10-CM | POA: Diagnosis not present

## 2016-11-07 DIAGNOSIS — Z87891 Personal history of nicotine dependence: Secondary | ICD-10-CM | POA: Diagnosis not present

## 2016-11-14 DIAGNOSIS — J069 Acute upper respiratory infection, unspecified: Secondary | ICD-10-CM | POA: Diagnosis not present

## 2016-11-28 DIAGNOSIS — L570 Actinic keratosis: Secondary | ICD-10-CM | POA: Diagnosis not present

## 2016-12-21 DIAGNOSIS — Z79899 Other long term (current) drug therapy: Secondary | ICD-10-CM | POA: Diagnosis not present

## 2016-12-21 DIAGNOSIS — Z01818 Encounter for other preprocedural examination: Secondary | ICD-10-CM | POA: Diagnosis not present

## 2016-12-21 DIAGNOSIS — J929 Pleural plaque without asbestos: Secondary | ICD-10-CM | POA: Diagnosis not present

## 2016-12-21 DIAGNOSIS — R931 Abnormal findings on diagnostic imaging of heart and coronary circulation: Secondary | ICD-10-CM | POA: Diagnosis not present

## 2016-12-21 DIAGNOSIS — R918 Other nonspecific abnormal finding of lung field: Secondary | ICD-10-CM | POA: Diagnosis not present

## 2016-12-21 DIAGNOSIS — F17210 Nicotine dependence, cigarettes, uncomplicated: Secondary | ICD-10-CM | POA: Diagnosis not present

## 2016-12-21 DIAGNOSIS — J8489 Other specified interstitial pulmonary diseases: Secondary | ICD-10-CM | POA: Diagnosis not present

## 2016-12-21 DIAGNOSIS — Z940 Kidney transplant status: Secondary | ICD-10-CM | POA: Diagnosis not present

## 2017-01-08 ENCOUNTER — Other Ambulatory Visit: Payer: Self-pay | Admitting: Family Medicine

## 2017-01-08 ENCOUNTER — Ambulatory Visit
Admission: RE | Admit: 2017-01-08 | Discharge: 2017-01-08 | Disposition: A | Discharge status: Home / Self Care | Payer: Medicare Other | Source: Ambulatory Visit | Attending: Family Medicine | Admitting: Family Medicine

## 2017-01-08 DIAGNOSIS — E039 Hypothyroidism, unspecified: Secondary | ICD-10-CM | POA: Diagnosis not present

## 2017-01-08 DIAGNOSIS — I714 Abdominal aortic aneurysm, without rupture: Secondary | ICD-10-CM | POA: Diagnosis not present

## 2017-01-08 DIAGNOSIS — Z0000 Encounter for general adult medical examination without abnormal findings: Secondary | ICD-10-CM | POA: Diagnosis not present

## 2017-01-08 DIAGNOSIS — R05 Cough: Secondary | ICD-10-CM | POA: Diagnosis not present

## 2017-01-08 DIAGNOSIS — I10 Essential (primary) hypertension: Secondary | ICD-10-CM | POA: Diagnosis not present

## 2017-01-08 DIAGNOSIS — R059 Cough, unspecified: Secondary | ICD-10-CM

## 2017-01-08 DIAGNOSIS — D649 Anemia, unspecified: Secondary | ICD-10-CM | POA: Diagnosis not present

## 2017-01-08 DIAGNOSIS — R0602 Shortness of breath: Secondary | ICD-10-CM

## 2017-01-08 DIAGNOSIS — J189 Pneumonia, unspecified organism: Secondary | ICD-10-CM | POA: Diagnosis not present

## 2017-01-17 DIAGNOSIS — E119 Type 2 diabetes mellitus without complications: Secondary | ICD-10-CM | POA: Diagnosis not present

## 2017-01-17 DIAGNOSIS — Z01810 Encounter for preprocedural cardiovascular examination: Secondary | ICD-10-CM | POA: Diagnosis not present

## 2017-01-17 DIAGNOSIS — Z539 Procedure and treatment not carried out, unspecified reason: Secondary | ICD-10-CM | POA: Diagnosis not present

## 2017-01-17 DIAGNOSIS — Z01812 Encounter for preprocedural laboratory examination: Secondary | ICD-10-CM | POA: Diagnosis not present

## 2017-01-18 DIAGNOSIS — J129 Viral pneumonia, unspecified: Secondary | ICD-10-CM | POA: Diagnosis not present

## 2017-01-24 DIAGNOSIS — J129 Viral pneumonia, unspecified: Secondary | ICD-10-CM | POA: Diagnosis not present

## 2017-01-24 DIAGNOSIS — R05 Cough: Secondary | ICD-10-CM | POA: Diagnosis not present

## 2017-01-24 DIAGNOSIS — M7989 Other specified soft tissue disorders: Secondary | ICD-10-CM | POA: Diagnosis not present

## 2017-01-24 DIAGNOSIS — R0602 Shortness of breath: Secondary | ICD-10-CM | POA: Diagnosis not present

## 2017-02-04 ENCOUNTER — Other Ambulatory Visit: Payer: Self-pay | Admitting: Family Medicine

## 2017-02-04 DIAGNOSIS — R062 Wheezing: Secondary | ICD-10-CM | POA: Diagnosis not present

## 2017-02-04 DIAGNOSIS — J189 Pneumonia, unspecified organism: Secondary | ICD-10-CM | POA: Diagnosis not present

## 2017-02-04 DIAGNOSIS — R05 Cough: Secondary | ICD-10-CM | POA: Diagnosis not present

## 2017-02-04 DIAGNOSIS — N179 Acute kidney failure, unspecified: Secondary | ICD-10-CM | POA: Diagnosis not present

## 2017-02-04 DIAGNOSIS — Z940 Kidney transplant status: Secondary | ICD-10-CM

## 2017-02-04 DIAGNOSIS — E1122 Type 2 diabetes mellitus with diabetic chronic kidney disease: Secondary | ICD-10-CM | POA: Diagnosis not present

## 2017-02-04 DIAGNOSIS — R0602 Shortness of breath: Secondary | ICD-10-CM

## 2017-02-04 DIAGNOSIS — E872 Acidosis: Secondary | ICD-10-CM | POA: Diagnosis not present

## 2017-02-04 DIAGNOSIS — R072 Precordial pain: Secondary | ICD-10-CM | POA: Diagnosis not present

## 2017-02-04 DIAGNOSIS — T8619 Other complication of kidney transplant: Secondary | ICD-10-CM | POA: Diagnosis not present

## 2017-02-04 DIAGNOSIS — J984 Other disorders of lung: Secondary | ICD-10-CM | POA: Diagnosis not present

## 2017-02-04 DIAGNOSIS — I272 Pulmonary hypertension, unspecified: Secondary | ICD-10-CM | POA: Diagnosis not present

## 2017-02-05 DIAGNOSIS — Z79890 Hormone replacement therapy (postmenopausal): Secondary | ICD-10-CM | POA: Diagnosis not present

## 2017-02-05 DIAGNOSIS — J181 Lobar pneumonia, unspecified organism: Secondary | ICD-10-CM | POA: Diagnosis not present

## 2017-02-05 DIAGNOSIS — Z87891 Personal history of nicotine dependence: Secondary | ICD-10-CM | POA: Diagnosis not present

## 2017-02-05 DIAGNOSIS — R197 Diarrhea, unspecified: Secondary | ICD-10-CM | POA: Diagnosis not present

## 2017-02-05 DIAGNOSIS — R918 Other nonspecific abnormal finding of lung field: Secondary | ICD-10-CM | POA: Diagnosis not present

## 2017-02-05 DIAGNOSIS — R509 Fever, unspecified: Secondary | ICD-10-CM | POA: Diagnosis not present

## 2017-02-05 DIAGNOSIS — I1311 Hypertensive heart and chronic kidney disease without heart failure, with stage 5 chronic kidney disease, or end stage renal disease: Secondary | ICD-10-CM | POA: Diagnosis not present

## 2017-02-05 DIAGNOSIS — R001 Bradycardia, unspecified: Secondary | ICD-10-CM | POA: Diagnosis not present

## 2017-02-05 DIAGNOSIS — Z7984 Long term (current) use of oral hypoglycemic drugs: Secondary | ICD-10-CM | POA: Diagnosis not present

## 2017-02-05 DIAGNOSIS — N189 Chronic kidney disease, unspecified: Secondary | ICD-10-CM | POA: Diagnosis not present

## 2017-02-05 DIAGNOSIS — E860 Dehydration: Secondary | ICD-10-CM | POA: Diagnosis present

## 2017-02-05 DIAGNOSIS — Z5181 Encounter for therapeutic drug level monitoring: Secondary | ICD-10-CM | POA: Diagnosis not present

## 2017-02-05 DIAGNOSIS — I129 Hypertensive chronic kidney disease with stage 1 through stage 4 chronic kidney disease, or unspecified chronic kidney disease: Secondary | ICD-10-CM | POA: Diagnosis not present

## 2017-02-05 DIAGNOSIS — R0602 Shortness of breath: Secondary | ICD-10-CM | POA: Diagnosis not present

## 2017-02-05 DIAGNOSIS — Z7982 Long term (current) use of aspirin: Secondary | ICD-10-CM | POA: Diagnosis not present

## 2017-02-05 DIAGNOSIS — I120 Hypertensive chronic kidney disease with stage 5 chronic kidney disease or end stage renal disease: Secondary | ICD-10-CM | POA: Diagnosis present

## 2017-02-05 DIAGNOSIS — E039 Hypothyroidism, unspecified: Secondary | ICD-10-CM | POA: Diagnosis not present

## 2017-02-05 DIAGNOSIS — R079 Chest pain, unspecified: Secondary | ICD-10-CM | POA: Diagnosis not present

## 2017-02-05 DIAGNOSIS — E861 Hypovolemia: Secondary | ICD-10-CM | POA: Diagnosis present

## 2017-02-05 DIAGNOSIS — E785 Hyperlipidemia, unspecified: Secondary | ICD-10-CM | POA: Diagnosis not present

## 2017-02-05 DIAGNOSIS — Z940 Kidney transplant status: Secondary | ICD-10-CM | POA: Diagnosis not present

## 2017-02-05 DIAGNOSIS — T8619 Other complication of kidney transplant: Secondary | ICD-10-CM | POA: Diagnosis not present

## 2017-02-05 DIAGNOSIS — I272 Pulmonary hypertension, unspecified: Secondary | ICD-10-CM | POA: Diagnosis not present

## 2017-02-05 DIAGNOSIS — R0902 Hypoxemia: Secondary | ICD-10-CM | POA: Diagnosis not present

## 2017-02-05 DIAGNOSIS — I1310 Hypertensive heart and chronic kidney disease without heart failure, with stage 1 through stage 4 chronic kidney disease, or unspecified chronic kidney disease: Secondary | ICD-10-CM | POA: Diagnosis not present

## 2017-02-05 DIAGNOSIS — E1122 Type 2 diabetes mellitus with diabetic chronic kidney disease: Secondary | ICD-10-CM | POA: Diagnosis not present

## 2017-02-05 DIAGNOSIS — Z888 Allergy status to other drugs, medicaments and biological substances status: Secondary | ICD-10-CM | POA: Diagnosis not present

## 2017-02-05 DIAGNOSIS — Z794 Long term (current) use of insulin: Secondary | ICD-10-CM | POA: Diagnosis not present

## 2017-02-05 DIAGNOSIS — J1000 Influenza due to other identified influenza virus with unspecified type of pneumonia: Secondary | ICD-10-CM | POA: Diagnosis present

## 2017-02-05 DIAGNOSIS — J9601 Acute respiratory failure with hypoxia: Secondary | ICD-10-CM | POA: Diagnosis not present

## 2017-02-05 DIAGNOSIS — R062 Wheezing: Secondary | ICD-10-CM | POA: Diagnosis not present

## 2017-02-05 DIAGNOSIS — E872 Acidosis: Secondary | ICD-10-CM | POA: Diagnosis not present

## 2017-02-05 DIAGNOSIS — J188 Other pneumonia, unspecified organism: Secondary | ICD-10-CM | POA: Diagnosis not present

## 2017-02-05 DIAGNOSIS — J189 Pneumonia, unspecified organism: Secondary | ICD-10-CM | POA: Diagnosis not present

## 2017-02-05 DIAGNOSIS — Z79899 Other long term (current) drug therapy: Secondary | ICD-10-CM | POA: Diagnosis not present

## 2017-02-05 DIAGNOSIS — D631 Anemia in chronic kidney disease: Secondary | ICD-10-CM | POA: Diagnosis present

## 2017-02-05 DIAGNOSIS — N183 Chronic kidney disease, stage 3 (moderate): Secondary | ICD-10-CM | POA: Diagnosis not present

## 2017-02-05 DIAGNOSIS — I10 Essential (primary) hypertension: Secondary | ICD-10-CM | POA: Diagnosis not present

## 2017-02-05 DIAGNOSIS — R05 Cough: Secondary | ICD-10-CM | POA: Diagnosis not present

## 2017-02-05 DIAGNOSIS — J101 Influenza due to other identified influenza virus with other respiratory manifestations: Secondary | ICD-10-CM | POA: Diagnosis not present

## 2017-02-05 DIAGNOSIS — E119 Type 2 diabetes mellitus without complications: Secondary | ICD-10-CM | POA: Diagnosis not present

## 2017-02-05 DIAGNOSIS — N179 Acute kidney failure, unspecified: Secondary | ICD-10-CM | POA: Diagnosis not present

## 2017-02-05 DIAGNOSIS — C679 Malignant neoplasm of bladder, unspecified: Secondary | ICD-10-CM | POA: Diagnosis not present

## 2017-02-05 DIAGNOSIS — E871 Hypo-osmolality and hyponatremia: Secondary | ICD-10-CM | POA: Diagnosis present

## 2017-02-18 DIAGNOSIS — J189 Pneumonia, unspecified organism: Secondary | ICD-10-CM | POA: Diagnosis not present

## 2017-02-18 DIAGNOSIS — R739 Hyperglycemia, unspecified: Secondary | ICD-10-CM | POA: Diagnosis not present

## 2017-02-19 DIAGNOSIS — E785 Hyperlipidemia, unspecified: Secondary | ICD-10-CM | POA: Diagnosis not present

## 2017-02-19 DIAGNOSIS — E119 Type 2 diabetes mellitus without complications: Secondary | ICD-10-CM | POA: Diagnosis not present

## 2017-02-19 DIAGNOSIS — D8989 Other specified disorders involving the immune mechanism, not elsewhere classified: Secondary | ICD-10-CM | POA: Diagnosis not present

## 2017-02-19 DIAGNOSIS — I10 Essential (primary) hypertension: Secondary | ICD-10-CM | POA: Diagnosis not present

## 2017-02-19 DIAGNOSIS — Z7952 Long term (current) use of systemic steroids: Secondary | ICD-10-CM | POA: Diagnosis not present

## 2017-02-19 DIAGNOSIS — Z794 Long term (current) use of insulin: Secondary | ICD-10-CM | POA: Diagnosis not present

## 2017-02-19 DIAGNOSIS — J181 Lobar pneumonia, unspecified organism: Secondary | ICD-10-CM | POA: Diagnosis not present

## 2017-02-19 DIAGNOSIS — Z79899 Other long term (current) drug therapy: Secondary | ICD-10-CM | POA: Diagnosis not present

## 2017-02-19 DIAGNOSIS — Z87891 Personal history of nicotine dependence: Secondary | ICD-10-CM | POA: Diagnosis not present

## 2017-02-19 DIAGNOSIS — Z7984 Long term (current) use of oral hypoglycemic drugs: Secondary | ICD-10-CM | POA: Diagnosis not present

## 2017-02-19 DIAGNOSIS — Z792 Long term (current) use of antibiotics: Secondary | ICD-10-CM | POA: Diagnosis not present

## 2017-02-19 DIAGNOSIS — Z940 Kidney transplant status: Secondary | ICD-10-CM | POA: Diagnosis not present

## 2017-02-19 DIAGNOSIS — C679 Malignant neoplasm of bladder, unspecified: Secondary | ICD-10-CM | POA: Diagnosis not present

## 2017-02-20 DIAGNOSIS — E119 Type 2 diabetes mellitus without complications: Secondary | ICD-10-CM | POA: Diagnosis not present

## 2017-02-20 DIAGNOSIS — R739 Hyperglycemia, unspecified: Secondary | ICD-10-CM | POA: Diagnosis not present

## 2017-02-20 DIAGNOSIS — E871 Hypo-osmolality and hyponatremia: Secondary | ICD-10-CM | POA: Diagnosis not present

## 2017-02-21 ENCOUNTER — Institutional Professional Consult (permissible substitution): Payer: Medicare Other | Admitting: Pulmonary Disease

## 2017-02-22 DIAGNOSIS — E875 Hyperkalemia: Secondary | ICD-10-CM | POA: Diagnosis not present

## 2017-02-22 DIAGNOSIS — E162 Hypoglycemia, unspecified: Secondary | ICD-10-CM | POA: Diagnosis not present

## 2017-02-22 DIAGNOSIS — E872 Acidosis: Secondary | ICD-10-CM | POA: Diagnosis not present

## 2017-03-07 DIAGNOSIS — Z940 Kidney transplant status: Secondary | ICD-10-CM | POA: Diagnosis not present

## 2017-03-07 DIAGNOSIS — E0821 Diabetes mellitus due to underlying condition with diabetic nephropathy: Secondary | ICD-10-CM | POA: Diagnosis not present

## 2017-03-07 DIAGNOSIS — Z794 Long term (current) use of insulin: Secondary | ICD-10-CM | POA: Diagnosis not present

## 2017-03-07 DIAGNOSIS — C679 Malignant neoplasm of bladder, unspecified: Secondary | ICD-10-CM | POA: Diagnosis not present

## 2017-03-22 DIAGNOSIS — I120 Hypertensive chronic kidney disease with stage 5 chronic kidney disease or end stage renal disease: Secondary | ICD-10-CM | POA: Diagnosis not present

## 2017-03-22 DIAGNOSIS — R42 Dizziness and giddiness: Secondary | ICD-10-CM | POA: Diagnosis not present

## 2017-03-22 DIAGNOSIS — Z7984 Long term (current) use of oral hypoglycemic drugs: Secondary | ICD-10-CM | POA: Diagnosis not present

## 2017-03-22 DIAGNOSIS — N186 End stage renal disease: Secondary | ICD-10-CM | POA: Diagnosis not present

## 2017-03-22 DIAGNOSIS — K5900 Constipation, unspecified: Secondary | ICD-10-CM | POA: Diagnosis not present

## 2017-03-22 DIAGNOSIS — Z7982 Long term (current) use of aspirin: Secondary | ICD-10-CM | POA: Diagnosis not present

## 2017-03-22 DIAGNOSIS — Z940 Kidney transplant status: Secondary | ICD-10-CM | POA: Diagnosis not present

## 2017-03-22 DIAGNOSIS — M25561 Pain in right knee: Secondary | ICD-10-CM | POA: Diagnosis not present

## 2017-03-22 DIAGNOSIS — D630 Anemia in neoplastic disease: Secondary | ICD-10-CM | POA: Diagnosis not present

## 2017-03-22 DIAGNOSIS — E1122 Type 2 diabetes mellitus with diabetic chronic kidney disease: Secondary | ICD-10-CM | POA: Diagnosis not present

## 2017-03-22 DIAGNOSIS — C679 Malignant neoplasm of bladder, unspecified: Secondary | ICD-10-CM | POA: Diagnosis not present

## 2017-03-22 DIAGNOSIS — E039 Hypothyroidism, unspecified: Secondary | ICD-10-CM | POA: Diagnosis not present

## 2017-03-22 DIAGNOSIS — R0602 Shortness of breath: Secondary | ICD-10-CM | POA: Diagnosis not present

## 2017-03-22 DIAGNOSIS — Z87891 Personal history of nicotine dependence: Secondary | ICD-10-CM | POA: Diagnosis not present

## 2017-03-22 DIAGNOSIS — R5383 Other fatigue: Secondary | ICD-10-CM | POA: Diagnosis not present

## 2017-03-29 DIAGNOSIS — D630 Anemia in neoplastic disease: Secondary | ICD-10-CM | POA: Diagnosis not present

## 2017-03-29 DIAGNOSIS — Z7984 Long term (current) use of oral hypoglycemic drugs: Secondary | ICD-10-CM | POA: Diagnosis not present

## 2017-03-29 DIAGNOSIS — C679 Malignant neoplasm of bladder, unspecified: Secondary | ICD-10-CM | POA: Diagnosis not present

## 2017-03-29 DIAGNOSIS — Z87891 Personal history of nicotine dependence: Secondary | ICD-10-CM | POA: Diagnosis not present

## 2017-03-29 DIAGNOSIS — E039 Hypothyroidism, unspecified: Secondary | ICD-10-CM | POA: Diagnosis not present

## 2017-03-29 DIAGNOSIS — C678 Malignant neoplasm of overlapping sites of bladder: Secondary | ICD-10-CM | POA: Diagnosis not present

## 2017-03-29 DIAGNOSIS — Z940 Kidney transplant status: Secondary | ICD-10-CM | POA: Diagnosis not present

## 2017-04-03 DIAGNOSIS — R339 Retention of urine, unspecified: Secondary | ICD-10-CM | POA: Diagnosis not present

## 2017-04-11 DIAGNOSIS — Z794 Long term (current) use of insulin: Secondary | ICD-10-CM | POA: Diagnosis not present

## 2017-04-11 DIAGNOSIS — C679 Malignant neoplasm of bladder, unspecified: Secondary | ICD-10-CM | POA: Diagnosis not present

## 2017-04-11 DIAGNOSIS — E0821 Diabetes mellitus due to underlying condition with diabetic nephropathy: Secondary | ICD-10-CM | POA: Diagnosis not present

## 2017-04-11 DIAGNOSIS — Z940 Kidney transplant status: Secondary | ICD-10-CM | POA: Diagnosis not present

## 2017-05-15 DIAGNOSIS — Z940 Kidney transplant status: Secondary | ICD-10-CM | POA: Diagnosis not present

## 2017-05-15 DIAGNOSIS — Z6827 Body mass index (BMI) 27.0-27.9, adult: Secondary | ICD-10-CM | POA: Diagnosis not present

## 2017-05-15 DIAGNOSIS — R300 Dysuria: Secondary | ICD-10-CM | POA: Diagnosis not present

## 2017-05-15 DIAGNOSIS — E119 Type 2 diabetes mellitus without complications: Secondary | ICD-10-CM | POA: Diagnosis not present

## 2017-05-15 DIAGNOSIS — E785 Hyperlipidemia, unspecified: Secondary | ICD-10-CM | POA: Diagnosis not present

## 2017-05-15 DIAGNOSIS — D494 Neoplasm of unspecified behavior of bladder: Secondary | ICD-10-CM | POA: Diagnosis not present

## 2017-05-16 DIAGNOSIS — R0600 Dyspnea, unspecified: Secondary | ICD-10-CM | POA: Diagnosis not present

## 2017-05-16 DIAGNOSIS — R918 Other nonspecific abnormal finding of lung field: Secondary | ICD-10-CM | POA: Diagnosis not present

## 2017-05-16 DIAGNOSIS — Z8551 Personal history of malignant neoplasm of bladder: Secondary | ICD-10-CM | POA: Diagnosis not present

## 2017-05-16 DIAGNOSIS — R0609 Other forms of dyspnea: Secondary | ICD-10-CM | POA: Diagnosis not present

## 2017-05-16 DIAGNOSIS — J181 Lobar pneumonia, unspecified organism: Secondary | ICD-10-CM | POA: Diagnosis not present

## 2017-05-16 DIAGNOSIS — Z79899 Other long term (current) drug therapy: Secondary | ICD-10-CM | POA: Diagnosis not present

## 2017-05-16 DIAGNOSIS — Z87891 Personal history of nicotine dependence: Secondary | ICD-10-CM | POA: Diagnosis not present

## 2017-05-27 DIAGNOSIS — Z940 Kidney transplant status: Secondary | ICD-10-CM | POA: Diagnosis not present

## 2017-05-29 DIAGNOSIS — Z87891 Personal history of nicotine dependence: Secondary | ICD-10-CM | POA: Diagnosis not present

## 2017-05-29 DIAGNOSIS — L570 Actinic keratosis: Secondary | ICD-10-CM | POA: Diagnosis not present

## 2017-05-29 DIAGNOSIS — Z940 Kidney transplant status: Secondary | ICD-10-CM | POA: Diagnosis not present

## 2017-05-29 DIAGNOSIS — L739 Follicular disorder, unspecified: Secondary | ICD-10-CM | POA: Diagnosis not present

## 2017-05-29 DIAGNOSIS — N3289 Other specified disorders of bladder: Secondary | ICD-10-CM | POA: Diagnosis not present

## 2017-05-29 DIAGNOSIS — C679 Malignant neoplasm of bladder, unspecified: Secondary | ICD-10-CM | POA: Diagnosis not present

## 2017-06-03 DIAGNOSIS — J181 Lobar pneumonia, unspecified organism: Secondary | ICD-10-CM | POA: Diagnosis not present

## 2017-06-14 DIAGNOSIS — Z940 Kidney transplant status: Secondary | ICD-10-CM | POA: Diagnosis not present

## 2017-06-23 IMAGING — CR DG CHEST 2V
2 series · 2 of 2 positions shown · non-contrast
Comparison: Chest x-ray of 09/19/2011

CLINICAL DATA: Persistent cough for 2 months Um a shortness of
breath

EXAM:
CHEST  2 VIEW

[w chest pa]
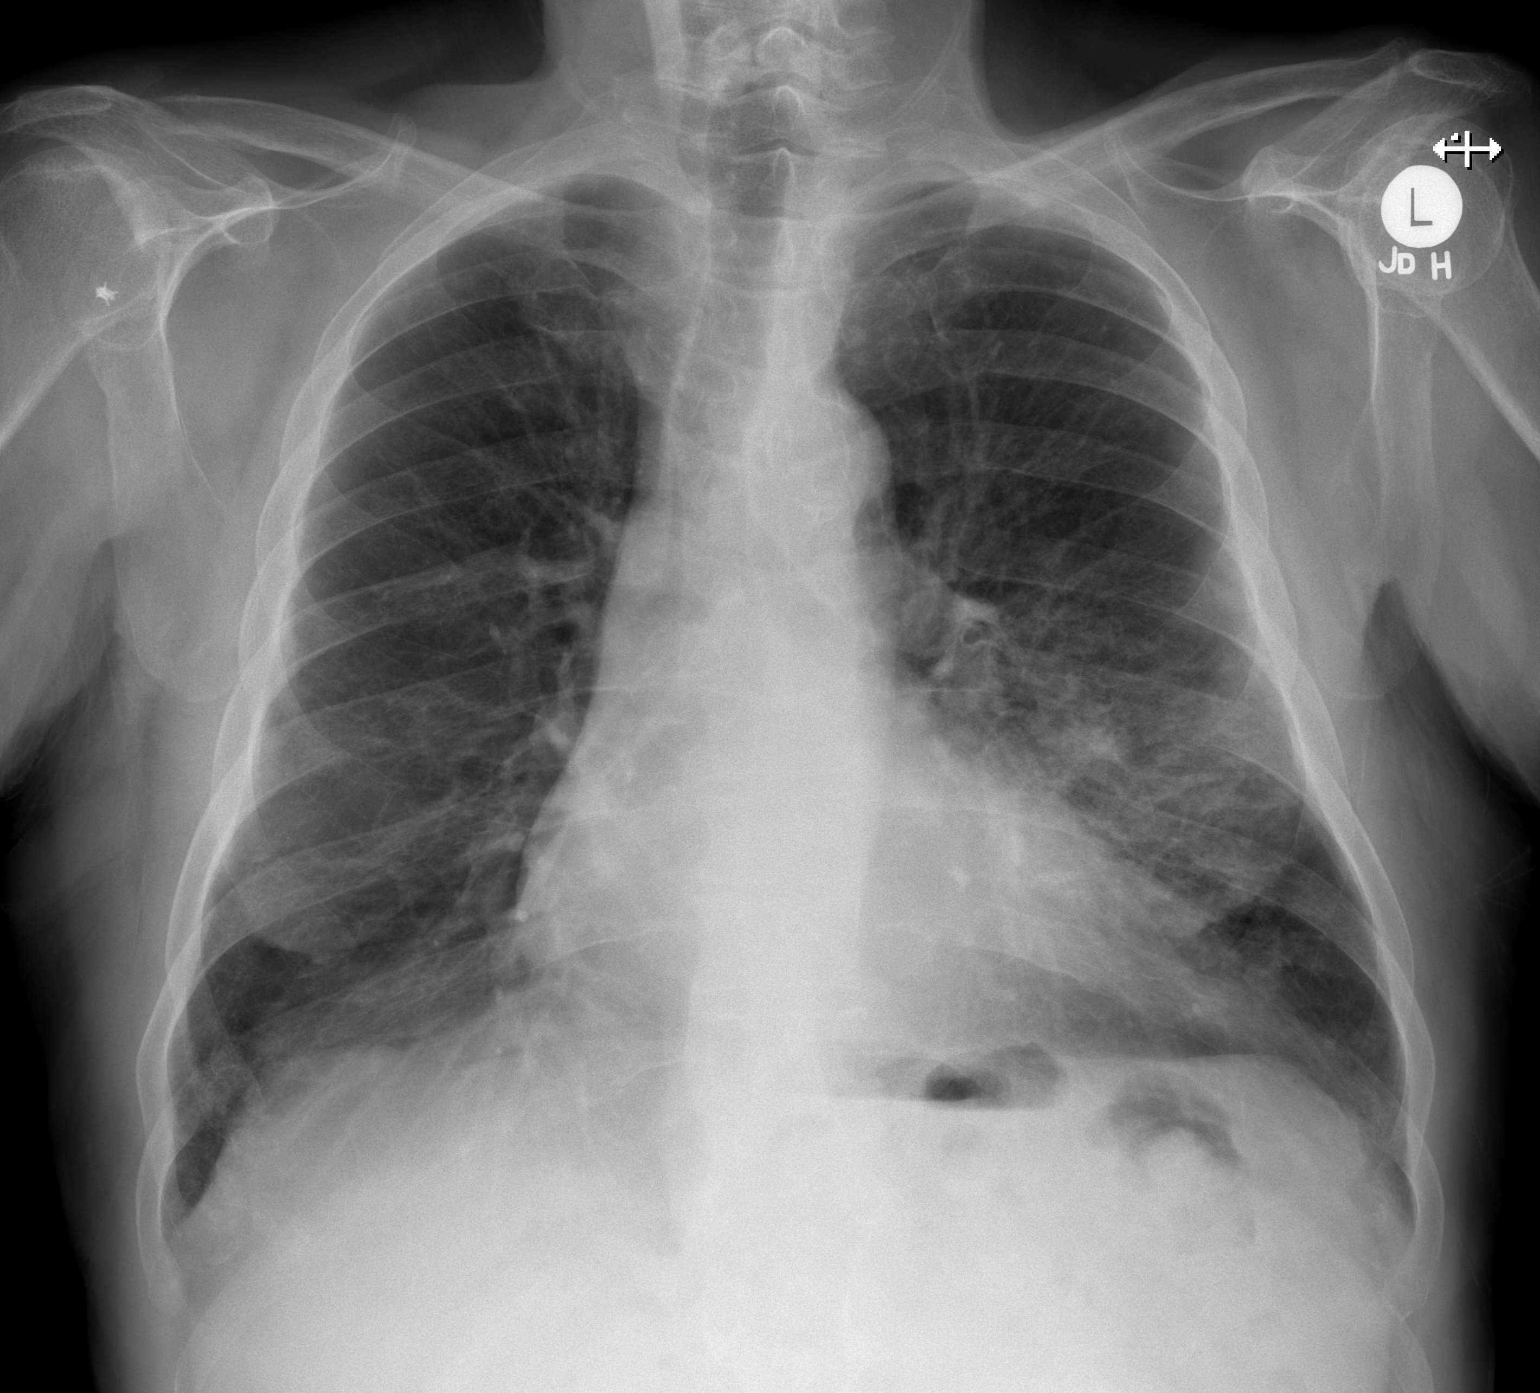

[w chest lat]
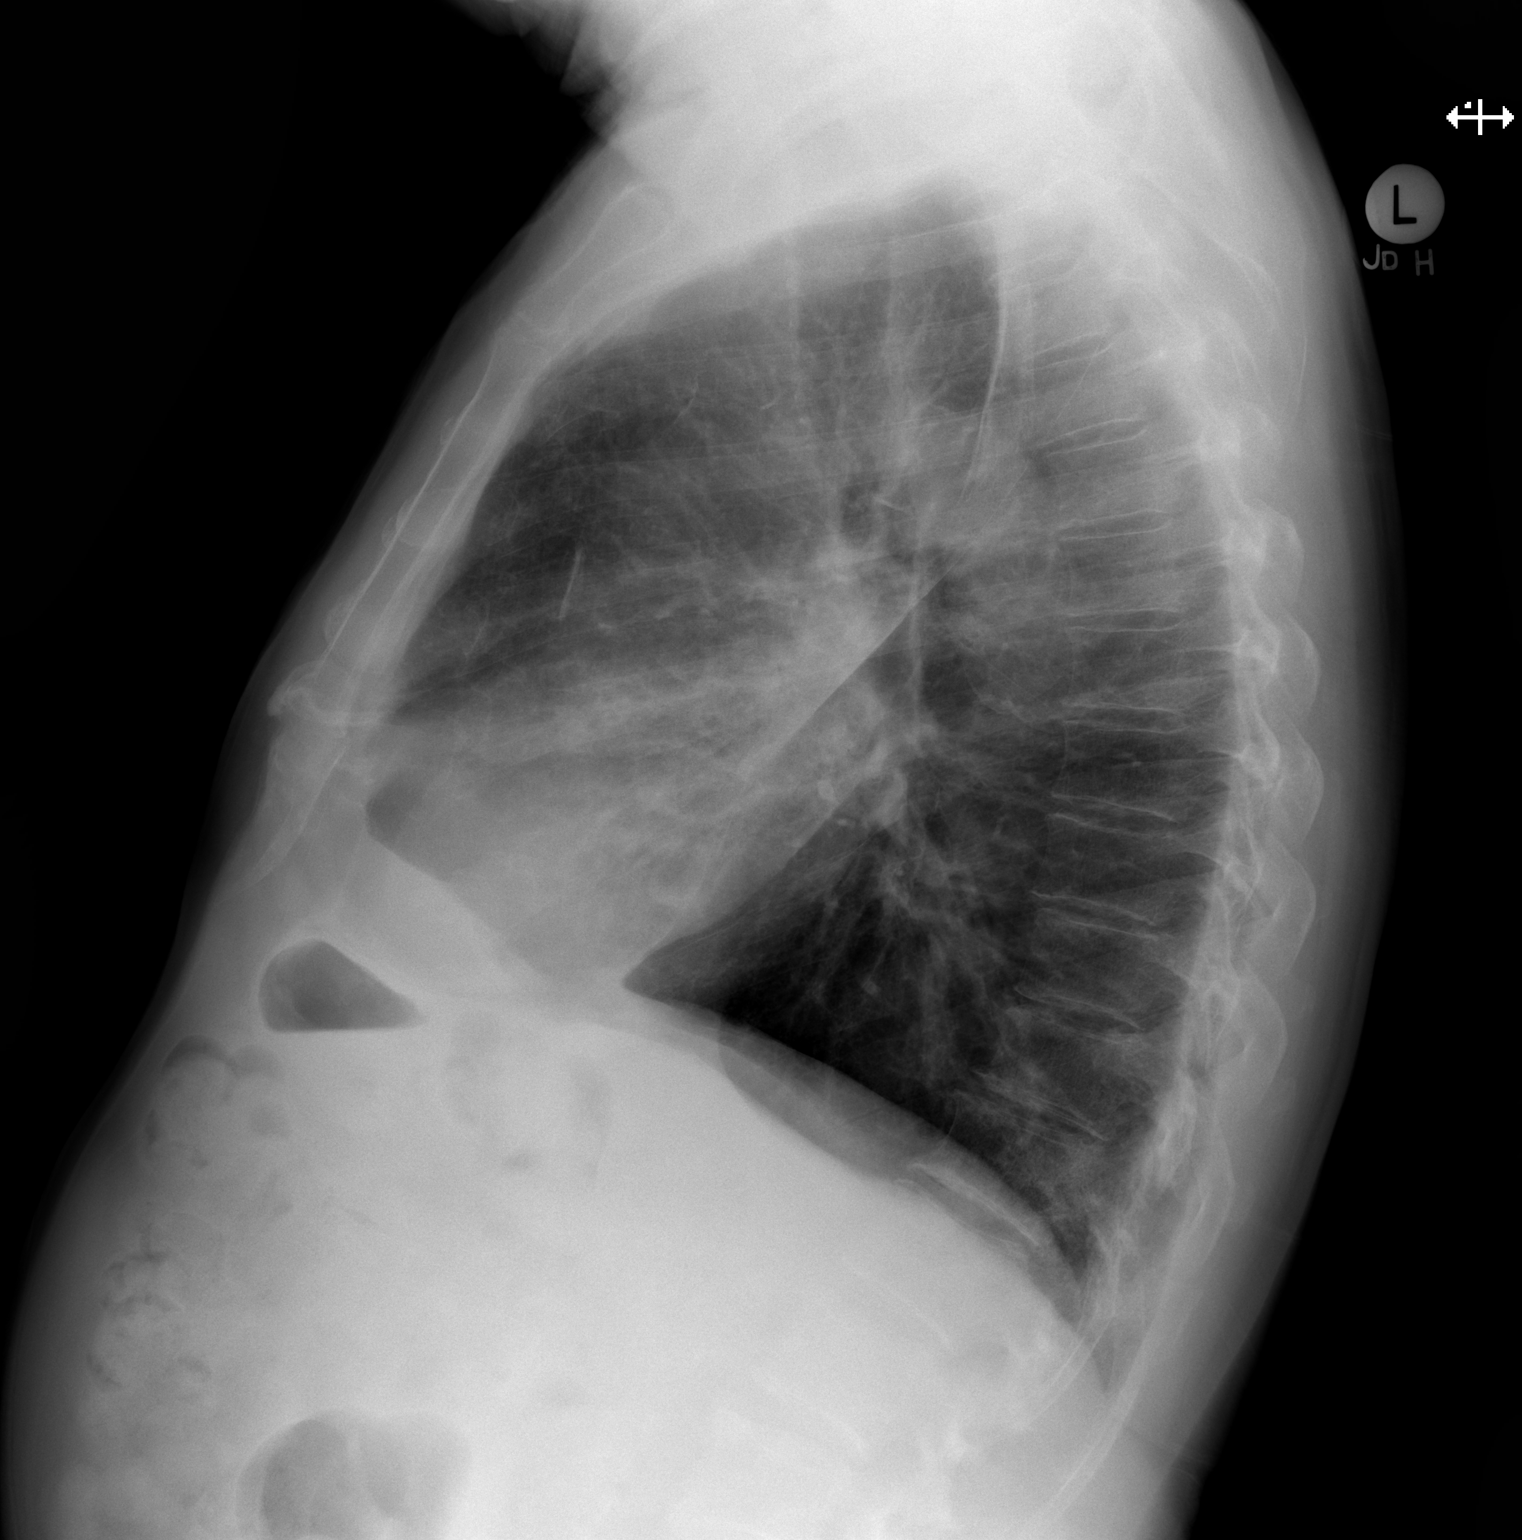

[2 of 2 positions shown; findings below may reference images not displayed]

FINDINGS: On the frontal view there is nodular opacity in the left mid lung
base adjacent to the left heart shadow. On the lateral view this
appears to represent pneumonia within the lingula. After interval
treatment, followup chest x-ray is recommended to ensure clearing.
The right lung is well aerated. Mediastinal and hilar contours are
unremarkable. The heart is borderline enlarged. No bony abnormality
is seen.
IMPRESSION: Probable lingular pneumonia creating a nodular pattern on the
frontal view. Recommend followup after interval treatment to ensure
clearing.

## 2017-07-23 DIAGNOSIS — R918 Other nonspecific abnormal finding of lung field: Secondary | ICD-10-CM | POA: Diagnosis not present

## 2017-07-23 DIAGNOSIS — D8989 Other specified disorders involving the immune mechanism, not elsewhere classified: Secondary | ICD-10-CM | POA: Diagnosis not present

## 2017-07-23 DIAGNOSIS — E119 Type 2 diabetes mellitus without complications: Secondary | ICD-10-CM | POA: Diagnosis not present

## 2017-07-23 DIAGNOSIS — I10 Essential (primary) hypertension: Secondary | ICD-10-CM | POA: Diagnosis not present

## 2017-07-23 DIAGNOSIS — Z940 Kidney transplant status: Secondary | ICD-10-CM | POA: Diagnosis not present

## 2017-07-23 DIAGNOSIS — J181 Lobar pneumonia, unspecified organism: Secondary | ICD-10-CM | POA: Diagnosis not present

## 2017-09-23 DIAGNOSIS — Z794 Long term (current) use of insulin: Secondary | ICD-10-CM | POA: Diagnosis not present

## 2017-09-23 DIAGNOSIS — E1121 Type 2 diabetes mellitus with diabetic nephropathy: Secondary | ICD-10-CM | POA: Diagnosis not present

## 2017-09-23 DIAGNOSIS — R5383 Other fatigue: Secondary | ICD-10-CM | POA: Diagnosis not present

## 2017-09-23 DIAGNOSIS — I700 Atherosclerosis of aorta: Secondary | ICD-10-CM | POA: Diagnosis not present

## 2017-09-23 DIAGNOSIS — R0602 Shortness of breath: Secondary | ICD-10-CM | POA: Diagnosis not present

## 2017-09-23 DIAGNOSIS — E785 Hyperlipidemia, unspecified: Secondary | ICD-10-CM | POA: Diagnosis not present

## 2017-09-23 DIAGNOSIS — J189 Pneumonia, unspecified organism: Secondary | ICD-10-CM | POA: Diagnosis not present

## 2017-09-23 DIAGNOSIS — I10 Essential (primary) hypertension: Secondary | ICD-10-CM | POA: Diagnosis not present

## 2017-09-23 DIAGNOSIS — R05 Cough: Secondary | ICD-10-CM | POA: Diagnosis not present

## 2017-10-02 DIAGNOSIS — R062 Wheezing: Secondary | ICD-10-CM | POA: Diagnosis not present

## 2017-10-02 DIAGNOSIS — Z940 Kidney transplant status: Secondary | ICD-10-CM | POA: Diagnosis not present

## 2017-10-02 DIAGNOSIS — I10 Essential (primary) hypertension: Secondary | ICD-10-CM | POA: Diagnosis not present

## 2017-10-02 DIAGNOSIS — J181 Lobar pneumonia, unspecified organism: Secondary | ICD-10-CM | POA: Diagnosis not present

## 2017-10-02 DIAGNOSIS — E0821 Diabetes mellitus due to underlying condition with diabetic nephropathy: Secondary | ICD-10-CM | POA: Diagnosis not present

## 2017-10-02 DIAGNOSIS — D72829 Elevated white blood cell count, unspecified: Secondary | ICD-10-CM | POA: Diagnosis not present

## 2017-10-02 DIAGNOSIS — Z794 Long term (current) use of insulin: Secondary | ICD-10-CM | POA: Diagnosis not present

## 2017-10-10 DIAGNOSIS — J90 Pleural effusion, not elsewhere classified: Secondary | ICD-10-CM | POA: Diagnosis not present

## 2017-10-10 DIAGNOSIS — R509 Fever, unspecified: Secondary | ICD-10-CM | POA: Diagnosis not present

## 2017-10-10 DIAGNOSIS — Z79899 Other long term (current) drug therapy: Secondary | ICD-10-CM | POA: Diagnosis not present

## 2017-10-10 DIAGNOSIS — I129 Hypertensive chronic kidney disease with stage 1 through stage 4 chronic kidney disease, or unspecified chronic kidney disease: Secondary | ICD-10-CM | POA: Diagnosis not present

## 2017-10-10 DIAGNOSIS — Z9981 Dependence on supplemental oxygen: Secondary | ICD-10-CM | POA: Diagnosis not present

## 2017-10-10 DIAGNOSIS — E039 Hypothyroidism, unspecified: Secondary | ICD-10-CM | POA: Diagnosis not present

## 2017-10-10 DIAGNOSIS — D849 Immunodeficiency, unspecified: Secondary | ICD-10-CM | POA: Diagnosis not present

## 2017-10-10 DIAGNOSIS — J156 Pneumonia due to other aerobic Gram-negative bacteria: Secondary | ICD-10-CM | POA: Diagnosis not present

## 2017-10-10 DIAGNOSIS — R918 Other nonspecific abnormal finding of lung field: Secondary | ICD-10-CM | POA: Diagnosis not present

## 2017-10-10 DIAGNOSIS — Z833 Family history of diabetes mellitus: Secondary | ICD-10-CM | POA: Diagnosis not present

## 2017-10-10 DIAGNOSIS — Z940 Kidney transplant status: Secondary | ICD-10-CM | POA: Diagnosis not present

## 2017-10-10 DIAGNOSIS — Z7982 Long term (current) use of aspirin: Secondary | ICD-10-CM | POA: Diagnosis not present

## 2017-10-10 DIAGNOSIS — E079 Disorder of thyroid, unspecified: Secondary | ICD-10-CM | POA: Diagnosis present

## 2017-10-10 DIAGNOSIS — R0902 Hypoxemia: Secondary | ICD-10-CM | POA: Diagnosis not present

## 2017-10-10 DIAGNOSIS — E1122 Type 2 diabetes mellitus with diabetic chronic kidney disease: Secondary | ICD-10-CM | POA: Diagnosis present

## 2017-10-10 DIAGNOSIS — Z8551 Personal history of malignant neoplasm of bladder: Secondary | ICD-10-CM | POA: Diagnosis not present

## 2017-10-10 DIAGNOSIS — N179 Acute kidney failure, unspecified: Secondary | ICD-10-CM | POA: Diagnosis not present

## 2017-10-10 DIAGNOSIS — R0603 Acute respiratory distress: Secondary | ICD-10-CM | POA: Diagnosis not present

## 2017-10-10 DIAGNOSIS — Z7951 Long term (current) use of inhaled steroids: Secondary | ICD-10-CM | POA: Diagnosis not present

## 2017-10-10 DIAGNOSIS — I10 Essential (primary) hypertension: Secondary | ICD-10-CM | POA: Diagnosis not present

## 2017-10-10 DIAGNOSIS — R0602 Shortness of breath: Secondary | ICD-10-CM | POA: Diagnosis not present

## 2017-10-10 DIAGNOSIS — Z794 Long term (current) use of insulin: Secondary | ICD-10-CM | POA: Diagnosis not present

## 2017-10-10 DIAGNOSIS — J189 Pneumonia, unspecified organism: Secondary | ICD-10-CM | POA: Diagnosis present

## 2017-10-10 DIAGNOSIS — N183 Chronic kidney disease, stage 3 (moderate): Secondary | ICD-10-CM | POA: Diagnosis present

## 2017-10-10 DIAGNOSIS — E1121 Type 2 diabetes mellitus with diabetic nephropathy: Secondary | ICD-10-CM | POA: Diagnosis present

## 2017-10-10 DIAGNOSIS — J181 Lobar pneumonia, unspecified organism: Secondary | ICD-10-CM | POA: Diagnosis not present

## 2017-10-10 DIAGNOSIS — Z87891 Personal history of nicotine dependence: Secondary | ICD-10-CM | POA: Diagnosis not present

## 2017-10-11 DIAGNOSIS — F17200 Nicotine dependence, unspecified, uncomplicated: Secondary | ICD-10-CM | POA: Diagnosis not present

## 2017-10-11 DIAGNOSIS — Z66 Do not resuscitate: Secondary | ICD-10-CM | POA: Diagnosis not present

## 2017-10-11 DIAGNOSIS — D899 Disorder involving the immune mechanism, unspecified: Secondary | ICD-10-CM | POA: Diagnosis not present

## 2017-10-11 DIAGNOSIS — I517 Cardiomegaly: Secondary | ICD-10-CM | POA: Diagnosis not present

## 2017-10-11 DIAGNOSIS — R0600 Dyspnea, unspecified: Secondary | ICD-10-CM | POA: Diagnosis not present

## 2017-10-11 DIAGNOSIS — C679 Malignant neoplasm of bladder, unspecified: Secondary | ICD-10-CM | POA: Diagnosis not present

## 2017-10-11 DIAGNOSIS — R0602 Shortness of breath: Secondary | ICD-10-CM | POA: Diagnosis not present

## 2017-10-11 DIAGNOSIS — Z906 Acquired absence of other parts of urinary tract: Secondary | ICD-10-CM | POA: Diagnosis not present

## 2017-10-11 DIAGNOSIS — J9601 Acute respiratory failure with hypoxia: Secondary | ICD-10-CM | POA: Diagnosis present

## 2017-10-11 DIAGNOSIS — K5900 Constipation, unspecified: Secondary | ICD-10-CM | POA: Diagnosis not present

## 2017-10-11 DIAGNOSIS — J449 Chronic obstructive pulmonary disease, unspecified: Secondary | ICD-10-CM | POA: Diagnosis not present

## 2017-10-11 DIAGNOSIS — R911 Solitary pulmonary nodule: Secondary | ICD-10-CM | POA: Diagnosis not present

## 2017-10-11 DIAGNOSIS — Z8679 Personal history of other diseases of the circulatory system: Secondary | ICD-10-CM | POA: Diagnosis not present

## 2017-10-11 DIAGNOSIS — E875 Hyperkalemia: Secondary | ICD-10-CM | POA: Diagnosis not present

## 2017-10-11 DIAGNOSIS — E871 Hypo-osmolality and hyponatremia: Secondary | ICD-10-CM | POA: Diagnosis present

## 2017-10-11 DIAGNOSIS — R197 Diarrhea, unspecified: Secondary | ICD-10-CM | POA: Diagnosis not present

## 2017-10-11 DIAGNOSIS — E785 Hyperlipidemia, unspecified: Secondary | ICD-10-CM | POA: Diagnosis present

## 2017-10-11 DIAGNOSIS — I110 Hypertensive heart disease with heart failure: Secondary | ICD-10-CM | POA: Diagnosis not present

## 2017-10-11 DIAGNOSIS — I129 Hypertensive chronic kidney disease with stage 1 through stage 4 chronic kidney disease, or unspecified chronic kidney disease: Secondary | ICD-10-CM | POA: Diagnosis not present

## 2017-10-11 DIAGNOSIS — J189 Pneumonia, unspecified organism: Secondary | ICD-10-CM | POA: Diagnosis present

## 2017-10-11 DIAGNOSIS — N183 Chronic kidney disease, stage 3 (moderate): Secondary | ICD-10-CM | POA: Diagnosis not present

## 2017-10-11 DIAGNOSIS — E119 Type 2 diabetes mellitus without complications: Secondary | ICD-10-CM | POA: Diagnosis present

## 2017-10-11 DIAGNOSIS — Z9981 Dependence on supplemental oxygen: Secondary | ICD-10-CM | POA: Diagnosis not present

## 2017-10-11 DIAGNOSIS — J984 Other disorders of lung: Secondary | ICD-10-CM | POA: Diagnosis not present

## 2017-10-11 DIAGNOSIS — R2689 Other abnormalities of gait and mobility: Secondary | ICD-10-CM | POA: Diagnosis not present

## 2017-10-11 DIAGNOSIS — J9691 Respiratory failure, unspecified with hypoxia: Secondary | ICD-10-CM | POA: Diagnosis not present

## 2017-10-11 DIAGNOSIS — C801 Malignant (primary) neoplasm, unspecified: Secondary | ICD-10-CM | POA: Diagnosis not present

## 2017-10-11 DIAGNOSIS — Z794 Long term (current) use of insulin: Secondary | ICD-10-CM | POA: Diagnosis not present

## 2017-10-11 DIAGNOSIS — E11649 Type 2 diabetes mellitus with hypoglycemia without coma: Secondary | ICD-10-CM | POA: Diagnosis not present

## 2017-10-11 DIAGNOSIS — R918 Other nonspecific abnormal finding of lung field: Secondary | ICD-10-CM | POA: Diagnosis not present

## 2017-10-11 DIAGNOSIS — Z79899 Other long term (current) drug therapy: Secondary | ICD-10-CM | POA: Diagnosis not present

## 2017-10-11 DIAGNOSIS — C3412 Malignant neoplasm of upper lobe, left bronchus or lung: Secondary | ICD-10-CM | POA: Diagnosis present

## 2017-10-11 DIAGNOSIS — J9621 Acute and chronic respiratory failure with hypoxia: Secondary | ICD-10-CM | POA: Diagnosis not present

## 2017-10-11 DIAGNOSIS — Z87891 Personal history of nicotine dependence: Secondary | ICD-10-CM | POA: Diagnosis not present

## 2017-10-11 DIAGNOSIS — E872 Acidosis: Secondary | ICD-10-CM | POA: Diagnosis not present

## 2017-10-11 DIAGNOSIS — J181 Lobar pneumonia, unspecified organism: Secondary | ICD-10-CM | POA: Diagnosis not present

## 2017-10-11 DIAGNOSIS — R0902 Hypoxemia: Secondary | ICD-10-CM | POA: Diagnosis not present

## 2017-10-11 DIAGNOSIS — Z940 Kidney transplant status: Secondary | ICD-10-CM | POA: Diagnosis not present

## 2017-10-11 DIAGNOSIS — Z8551 Personal history of malignant neoplasm of bladder: Secondary | ICD-10-CM | POA: Diagnosis not present

## 2017-10-11 DIAGNOSIS — N189 Chronic kidney disease, unspecified: Secondary | ICD-10-CM | POA: Diagnosis not present

## 2017-10-11 DIAGNOSIS — D649 Anemia, unspecified: Secondary | ICD-10-CM | POA: Diagnosis not present

## 2017-10-11 DIAGNOSIS — N059 Unspecified nephritic syndrome with unspecified morphologic changes: Secondary | ICD-10-CM | POA: Diagnosis not present

## 2017-10-11 DIAGNOSIS — Z515 Encounter for palliative care: Secondary | ICD-10-CM | POA: Diagnosis not present

## 2017-10-11 DIAGNOSIS — Z8639 Personal history of other endocrine, nutritional and metabolic disease: Secondary | ICD-10-CM | POA: Diagnosis not present

## 2017-10-11 DIAGNOSIS — C799 Secondary malignant neoplasm of unspecified site: Secondary | ICD-10-CM | POA: Diagnosis not present

## 2017-10-11 DIAGNOSIS — E1122 Type 2 diabetes mellitus with diabetic chronic kidney disease: Secondary | ICD-10-CM | POA: Diagnosis not present

## 2017-10-11 DIAGNOSIS — I10 Essential (primary) hypertension: Secondary | ICD-10-CM | POA: Diagnosis present

## 2017-10-11 DIAGNOSIS — E039 Hypothyroidism, unspecified: Secondary | ICD-10-CM | POA: Diagnosis present

## 2017-10-18 DIAGNOSIS — J9691 Respiratory failure, unspecified with hypoxia: Secondary | ICD-10-CM | POA: Diagnosis not present

## 2017-10-18 DIAGNOSIS — Z940 Kidney transplant status: Secondary | ICD-10-CM | POA: Diagnosis not present

## 2017-10-18 DIAGNOSIS — J189 Pneumonia, unspecified organism: Secondary | ICD-10-CM | POA: Diagnosis not present

## 2017-10-18 DIAGNOSIS — I110 Hypertensive heart disease with heart failure: Secondary | ICD-10-CM | POA: Diagnosis not present

## 2017-10-18 DIAGNOSIS — R918 Other nonspecific abnormal finding of lung field: Secondary | ICD-10-CM | POA: Diagnosis not present

## 2017-10-18 DIAGNOSIS — Z9981 Dependence on supplemental oxygen: Secondary | ICD-10-CM | POA: Diagnosis not present

## 2017-10-18 DIAGNOSIS — N183 Chronic kidney disease, stage 3 (moderate): Secondary | ICD-10-CM | POA: Diagnosis not present

## 2017-10-18 DIAGNOSIS — Z79899 Other long term (current) drug therapy: Secondary | ICD-10-CM | POA: Diagnosis not present

## 2017-10-18 DIAGNOSIS — R911 Solitary pulmonary nodule: Secondary | ICD-10-CM | POA: Diagnosis not present

## 2017-10-18 DIAGNOSIS — J9621 Acute and chronic respiratory failure with hypoxia: Secondary | ICD-10-CM | POA: Diagnosis not present

## 2017-10-18 DIAGNOSIS — E871 Hypo-osmolality and hyponatremia: Secondary | ICD-10-CM | POA: Diagnosis not present

## 2017-12-06 DEATH — deceased
